# Patient Record
Sex: Male | Born: 1985 | Race: White | Hispanic: No | Marital: Married | Smoking: Former smoker
Health system: Southern US, Community
[De-identification: ages and names within clinical notes are randomized; demographics above are authoritative.]

## PROBLEM LIST (undated history)

## (undated) DIAGNOSIS — S0500XA Injury of conjunctiva and corneal abrasion without foreign body, unspecified eye, initial encounter: Secondary | ICD-10-CM

## (undated) DIAGNOSIS — L509 Urticaria, unspecified: Secondary | ICD-10-CM

## (undated) DIAGNOSIS — R079 Chest pain, unspecified: Secondary | ICD-10-CM

## (undated) DIAGNOSIS — K5090 Crohn's disease, unspecified, without complications: Secondary | ICD-10-CM

## (undated) DIAGNOSIS — J329 Chronic sinusitis, unspecified: Secondary | ICD-10-CM

## (undated) DIAGNOSIS — R1011 Right upper quadrant pain: Secondary | ICD-10-CM

## (undated) DIAGNOSIS — S20212A Contusion of left front wall of thorax, initial encounter: Secondary | ICD-10-CM

## (undated) HISTORY — DX: Contusion of left front wall of thorax, initial encounter: S20.212A

## (undated) HISTORY — DX: Chronic sinusitis, unspecified: J32.9

## (undated) HISTORY — DX: Crohn's disease, unspecified, without complications: K50.90

## (undated) HISTORY — DX: Urticaria, unspecified: L50.9

## (undated) HISTORY — DX: Right upper quadrant pain: R10.11

## (undated) HISTORY — PX: KNEE SURGERY: SHX244

## (undated) HISTORY — PX: EYE SURGERY: SHX253

## (undated) HISTORY — DX: Chest pain, unspecified: R07.9

## (undated) HISTORY — PX: VASECTOMY: SHX75

---
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
## 2004-06-04 ENCOUNTER — Ambulatory Visit (HOSPITAL_COMMUNITY): Admission: RE | Admit: 2004-06-04 | Discharge: 2004-06-04 | Payer: Self-pay | Admitting: Orthopedic Surgery

## 2006-04-20 ENCOUNTER — Emergency Department (HOSPITAL_COMMUNITY): Admission: EM | Admit: 2006-04-20 | Discharge: 2006-04-20 | Payer: Self-pay | Admitting: Emergency Medicine

## 2009-03-12 ENCOUNTER — Encounter: Admission: RE | Admit: 2009-03-12 | Discharge: 2009-03-12 | Payer: Self-pay | Admitting: Gastroenterology

## 2009-06-01 ENCOUNTER — Emergency Department (HOSPITAL_COMMUNITY): Admission: EM | Admit: 2009-06-01 | Discharge: 2009-06-01 | Payer: Self-pay | Admitting: Emergency Medicine

## 2009-06-03 ENCOUNTER — Ambulatory Visit (HOSPITAL_COMMUNITY): Admission: RE | Admit: 2009-06-03 | Discharge: 2009-06-03 | Payer: Self-pay | Admitting: Orthopedic Surgery

## 2009-08-06 ENCOUNTER — Encounter: Admission: RE | Admit: 2009-08-06 | Discharge: 2009-08-06 | Payer: Self-pay | Admitting: Gastroenterology

## 2010-07-14 ENCOUNTER — Emergency Department (HOSPITAL_COMMUNITY): Admission: EM | Admit: 2010-07-14 | Discharge: 2010-07-14 | Payer: Self-pay | Admitting: Emergency Medicine

## 2010-07-19 ENCOUNTER — Ambulatory Visit: Payer: Self-pay | Admitting: Family Medicine

## 2010-07-19 DIAGNOSIS — R079 Chest pain, unspecified: Secondary | ICD-10-CM | POA: Insufficient documentation

## 2010-07-19 DIAGNOSIS — R1011 Right upper quadrant pain: Secondary | ICD-10-CM | POA: Insufficient documentation

## 2010-07-19 LAB — CONVERTED CEMR LAB
Glucose, Urine, Semiquant: NEGATIVE
WBC Urine, dipstick: NEGATIVE

## 2010-07-20 ENCOUNTER — Encounter: Payer: Self-pay | Admitting: Family Medicine

## 2010-07-27 ENCOUNTER — Encounter: Payer: Self-pay | Admitting: Family Medicine

## 2010-09-23 ENCOUNTER — Ambulatory Visit: Payer: Self-pay | Admitting: Family Medicine

## 2010-09-23 DIAGNOSIS — S20219A Contusion of unspecified front wall of thorax, initial encounter: Secondary | ICD-10-CM

## 2010-10-09 ENCOUNTER — Ambulatory Visit: Payer: Self-pay | Admitting: Emergency Medicine

## 2010-10-09 LAB — CONVERTED CEMR LAB: Rapid Strep: NEGATIVE

## 2011-01-18 NOTE — Assessment & Plan Note (Signed)
Summary: Fever, HA, sorethroat, cough-yellow x 4 dys rm 4   Vital Signs:  Patient Profile:   25 Years Old Male CC:      Cold & URI symptoms Height:     74 inches Weight:      175 pounds O2 Sat:      99 % O2 treatment:    Room Air Temp:     98.7 degrees F oral Pulse rate:   108 / minute Pulse rhythm:   regular Resp:     16 per minute BP sitting:   141 / 84  (left arm) Cuff size:   regular  Vitals Entered By: Areta Haber CMA (October 09, 2010 2:56 PM)                  Current Allergies: ! * MILK           History of Present Illness History from: patient Chief Complaint: Cold & URI symptoms History of Present Illness: Patient complains of onset of cold symptoms for 5 days.  They have been using no OTC meds. + sore throat + cough No pleuritic pain No wheezing + nasal congestion + post-nasal drainage No sinus pain/pressure No itchy/red eyes No earache No hemoptysis No SOB No chills/sweats + fever No nausea No vomiting No abdominal pain No diarrhea No skin rashes No fatigue No myalgias No headache   Current Problems: UPPER RESPIRATORY INFECTION, ACUTE (ICD-465.9) CONTUSION, LEFT RIB (ICD-922.1) COUGH (ICD-786.2) RUQ PAIN (ICD-789.01) CHEST PAIN, RIGHT (ICD-786.50)   Current Meds ASACOL HD 800 MG TBEC (MESALAMINE)  MOBIC 7.5 MG TABS (MELOXICAM) sig 1 by mouth q day * TRAMADOL 50 sig 1 by mouth q6-8hrs as needed for break through pain TRAMADOL HCL 50 MG TABS (TRAMADOL HCL) sig 1 by mouth q6-8hrs prn CHERATUSSIN AC 100-10 MG/5ML SYRP (GUAIFENESIN-CODEINE) 5-10cc by mouth every 6 hours as needed for cough ZITHROMAX Z-PAK 250 MG TABS (AZITHROMYCIN) use as directed  REVIEW OF SYSTEMS Constitutional Symptoms       Complains of fever.     Denies chills, night sweats, weight loss, weight gain, and fatigue.  Eyes       Denies change in vision, eye pain, eye discharge, glasses, contact lenses, and eye surgery. Ear/Nose/Throat/Mouth  Complains of sore throat.      Denies hearing loss/aids, change in hearing, ear pain, ear discharge, dizziness, frequent runny nose, frequent nose bleeds, sinus problems, hoarseness, and tooth pain or bleeding.      Comments: x 4 dys Respiratory       Complains of productive cough.      Denies dry cough, wheezing, shortness of breath, asthma, bronchitis, and emphysema/COPD.  Cardiovascular       Denies murmurs, chest pain, and tires easily with exhertion.    Gastrointestinal       Denies stomach pain, nausea/vomiting, diarrhea, constipation, blood in bowel movements, and indigestion. Genitourniary       Denies painful urination, kidney stones, and loss of urinary control. Neurological       Complains of headaches.      Denies paralysis, seizures, and fainting/blackouts. Musculoskeletal       Denies muscle pain, joint pain, joint stiffness, decreased range of motion, redness, swelling, muscle weakness, and gout.  Skin       Denies bruising, unusual mles/lumps or sores, and hair/skin or nail changes.  Psych       Denies mood changes, temper/anger issues, anxiety/stress, speech problems, depression, and sleep problems. Other Comments: yellow. Pt has  not seen his PCP for this.   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  mild tonsillar enlargment Neck: neck supple,  trachea midline, no masses Chest/Lungs: no rales,  wheezes, or rhonchi bilateral, breath sounds equal without effort Heart: regular rate and  rhythm, no murmur Skin: no obvious rashes or lesions MSE: oriented to time, place, and person Assessment New Problems: UPPER RESPIRATORY INFECTION, ACUTE (ICD-465.9)   Patient Education: Patient and/or caregiver instructed in the following: rest, fluids, Tylenol prn.  Plan New Medications/Changes: ZITHROMAX Z-PAK 250 MG TABS (AZITHROMYCIN) use as directed  #1 x 0, 10/09/2010, Hoyt Koch MD CHERATUSSIN AC 100-10 MG/5ML SYRP (GUAIFENESIN-CODEINE) 5-10cc by mouth every 6 hours as needed for cough  #6oz x 0, 10/09/2010, Hoyt Koch MD  New Orders: Est. Patient Level III [45409] Rapid Strep [81191] Planning Comments:   Sudafed + Cough medicine for a few days If getting worse, can start the Zpak in 5 days Follow-up with your primary care physician if not improving or if getting worse   The patient and/or caregiver has been counseled thoroughly with regard to medications prescribed including dosage, schedule, interactions, rationale for use, and possible side effects and they verbalize understanding.  Diagnoses and expected course of recovery discussed and will return if not improved as expected or if the condition worsens. Patient and/or caregiver verbalized understanding.  Prescriptions: ZITHROMAX Z-PAK 250 MG TABS (AZITHROMYCIN) use as directed  #1 x 0   Entered and Authorized by:   Hoyt Koch MD   Signed by:   Hoyt Koch MD on 10/09/2010   Method used:   Print then Give to Patient   RxID:   [id number] CHERATUSSIN AC 100-10 MG/5ML SYRP (GUAIFENESIN-CODEINE) 5-10cc by mouth every 6 hours as needed for cough  #6oz x 0   Entered and Authorized by:   Hoyt Koch MD   Signed by:   Hoyt Koch MD on 10/09/2010   Method used:   Print then Give to Patient   RxID:   [id number]   Orders Added: 1)  Est. Patient Level III [10272] 2)  Rapid Strep  [53664]    Laboratory Results  Date/Time Received: October 09, 2010 3:05 PM  Date/Time Reported: October 09, 2010 3:05 PM   Other Tests  Rapid Strep: negative  Kit Test Internal QC: Negative   (Normal Range: Negative)
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
## 2011-01-18 NOTE — Assessment & Plan Note (Signed)
Summary: PAIN IN RIGHT LUNG   Vital Signs:  Patient Profile:   25 Years Old Male CC:      Right chest/flank pain x 2 days Height:     74 inches Weight:      179 pounds O2 Sat:      97 % O2 treatment:    Room Air Temp:     98.7 degrees F oral Pulse rate:   93 / minute Pulse rhythm:   regular Resp:     14 per minute BP sitting:   135 / 80  (right arm) Cuff size:   regular  Vitals Entered By: Georgiann Mccoy (July 19, 2010 2:19 PM)                  Current Allergies: ! * MILKHistory of Present Illness Chief Complaint: Right chest/flank pain x 2 days History of Present Illness:  Subjective:  Patient complains of onset of pleuritic pain in his right lower lateral chest two days ago.   The pain is worse in the morning.  He describes it as sharp and stabbing.  The pain is worse with deep inspiration and supine on his right side.  He denies cough.  No recent chest injury.  No fevers, chills, and sweats. One week ago he developed groin pain and was evaluated by a urologist two days later.  Patient states he was diagnosed with a "blood clot" in the base of his penis, treated with doxycycline, oxycodone, and NSAID.  The groin pain has resolved.  No GI or GU symptoms. He has a history of chron's disease, presently under control.  He states that he has a history of smoking and using alcohol to excess.  Current Meds ASACOL HD 800 MG TBEC (MESALAMINE)  ALIGN  CAPS (PROBIOTIC PRODUCT)   REVIEW OF SYSTEMS Constitutional Symptoms      Denies fever, chills, night sweats, weight loss, weight gain, and fatigue.  Eyes       Denies change in vision, eye pain, eye discharge, glasses, contact lenses, and eye surgery. Ear/Nose/Throat/Mouth       Denies hearing loss/aids, change in hearing, ear pain, ear discharge, dizziness, frequent runny nose, frequent nose bleeds, sinus problems, sore throat, hoarseness, and tooth pain or bleeding.  Respiratory       Denies dry cough, productive cough,  wheezing, shortness of breath, asthma, bronchitis, and emphysema/COPD.  Cardiovascular       Complains of chest pain.      Denies murmurs and tires easily with exhertion.    Gastrointestinal       Denies stomach pain, nausea/vomiting, diarrhea, constipation, blood in bowel movements, and indigestion. Genitourniary       Denies painful urination, kidney stones, and loss of urinary control. Neurological       Denies paralysis, seizures, and fainting/blackouts. Musculoskeletal       Denies muscle pain, joint pain, joint stiffness, decreased range of motion, redness, swelling, muscle weakness, and gout.  Skin       Denies bruising, unusual mles/lumps or sores, and hair/skin or nail changes.  Psych       Denies mood changes, temper/anger issues, anxiety/stress, speech problems, depression, and sleep problems.  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  Extraocular movement is intact.  Conjunctivae are not inflamed.  Pharynx:  Minimal erythema Neck:  Supple.  No adenopathy is present.  No thyromegaly is present  Lungs:  Clear to auscultation.  Breath sounds are equal.  Chest:  no tenderness to palpation Heart:  Regular rate and rhythm without murmurs, rubs, or gallops.  Abdomen:  Mild tenderness over liver without masses or hepatosplenomegaly.  Bowel sounds are present.  No CVA or flank tenderness.  Skin:  No rash urinalysis (dipstick):  mod bili, mod blood. + protein, urobil present Chest X-ray:  negative Assessment New Problems: RUQ PAIN (ICD-789.01) CHEST PAIN, RIGHT (ICD-786.50)  PROBABLY MUSCULOSKELETAL PAIN.  POSSIBLE EARLY  SHINGLES.  HOWEVER,  NOTE BILIRUBEN IN URINE AND TENDERNESS OVER LIVER EDGE.  PATIENT ADMITS TO HEAVY ALCOHOL USE  Plan New Orders: T-Chest x-ray, 2 views [71020] Urinalysis [81003-65000] CBC w/Diff [[id number]] T-Comprehensive Metabolic Panel [09811-91478] New Patient Level III [29562] Planning Comments:   Check CMP  Advised to avoid alcohol, Tylenol. Call if rash develops (will need Rx for Valtrex) Follow-up with PCP if not improving         The patient and/or caregiver has been counseled thoroughly with regard to medications prescribed including dosage, schedule, interactions, rationale for use, and possible side effects and they verbalize understanding.  Diagnoses and expected course of recovery discussed and will return if not improved as expected or if the condition worsens. Patient and/or caregiver verbalized understanding.   Orders Added: 1)  T-Chest x-ray, 2 views [71020] 2)  Urinalysis [81003-65000] 3)  CBC w/Diff [13086-57846] 4)  T-Comprehensive Metabolic Panel [96295-28413] 5)  New Patient Level III [99203]  Laboratory Results   Urine Tests  Date/Time Received: July 19, 2010 3:04 PM  Date/Time Reported: July 19, 2010 3:04 PM   Routine Urinalysis   Color: yellow Appearance: Clear Glucose: negative   (Normal Range: Negative) Bilirubin: moderate   (Normal Range: Negative) Ketone: trace (5)   (Normal Range: Negative) Spec. Gravity: 1.025   (Normal Range: 1.003-1.035) Blood: moderate   (Normal Range: Negative) pH: 6.5   (Normal Range: 5.0-8.0) Protein: 30   (Normal Range: Negative) Urobilinogen: 1.0   (Normal Range: 0-1) Nitrite: negative   (Normal Range: Negative) Leukocyte Esterace: negative   (Normal Range: Negative)       Appended Document: PAIN IN RIGHT LUNG CBC:  WBC 7.6, Hgb 15.2, platelets 264  Appended Document: PAIN IN RIGHT LUNG CMP results:  glucose slightly elevated 106.  Alkaline phosphatase slightly elevated at 119. Notified  patient:  left message.

## 2011-03-05 LAB — URINE MICROSCOPIC-ADD ON

## 2011-03-05 LAB — URINALYSIS, ROUTINE W REFLEX MICROSCOPIC
Nitrite: NEGATIVE
Specific Gravity, Urine: 1.021 (ref 1.005–1.030)
Urobilinogen, UA: 1 mg/dL (ref 0.0–1.0)

## 2011-03-31 ENCOUNTER — Encounter (HOSPITAL_BASED_OUTPATIENT_CLINIC_OR_DEPARTMENT_OTHER)
Admission: RE | Admit: 2011-03-31 | Discharge: 2011-03-31 | Disposition: A | Discharge status: Home / Self Care | Payer: BC Managed Care – PPO | Source: Ambulatory Visit | Attending: General Surgery | Admitting: General Surgery

## 2011-03-31 LAB — HEPATIC FUNCTION PANEL
Alkaline Phosphatase: 80 U/L (ref 39–117)
Indirect Bilirubin: 0.6 mg/dL (ref 0.3–0.9)
Total Bilirubin: 0.8 mg/dL (ref 0.3–1.2)

## 2011-03-31 LAB — BASIC METABOLIC PANEL
BUN: 9 mg/dL (ref 6–23)
CO2: 27 mEq/L (ref 19–32)
Chloride: 104 mEq/L (ref 96–112)
Creatinine, Ser: 0.97 mg/dL (ref 0.4–1.5)

## 2011-04-01 ENCOUNTER — Other Ambulatory Visit: Payer: Self-pay | Admitting: General Surgery

## 2011-04-01 ENCOUNTER — Ambulatory Visit (HOSPITAL_BASED_OUTPATIENT_CLINIC_OR_DEPARTMENT_OTHER)
Admission: RE | Admit: 2011-04-01 | Discharge: 2011-04-01 | Disposition: A | Discharge status: Home / Self Care | Payer: BC Managed Care – PPO | Source: Ambulatory Visit | Attending: General Surgery | Admitting: General Surgery

## 2011-04-01 DIAGNOSIS — Z01812 Encounter for preprocedural laboratory examination: Secondary | ICD-10-CM | POA: Insufficient documentation

## 2011-04-01 DIAGNOSIS — K603 Anal fistula, unspecified: Secondary | ICD-10-CM | POA: Insufficient documentation

## 2011-04-01 DIAGNOSIS — K5090 Crohn's disease, unspecified, without complications: Secondary | ICD-10-CM | POA: Insufficient documentation

## 2011-04-01 DIAGNOSIS — F17200 Nicotine dependence, unspecified, uncomplicated: Secondary | ICD-10-CM | POA: Insufficient documentation

## 2011-04-01 LAB — POCT HEMOGLOBIN-HEMACUE: Hemoglobin: 14.7 g/dL (ref 13.0–17.0)

## 2011-04-11 NOTE — Op Note (Signed)
NAMEZORION, NIMS              ACCOUNT NO.:  [id number]  MEDICAL RECORD NO.:  [id number]           PATIENT TYPE:  LOCATION:                                 FACILITY:  PHYSICIAN:  Marland Kitchen T. 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  POSTOPERATIVE DIAGNOSIS:  Fistula in ano.  SURGICAL PROCEDURES:  Examination under anesthesia and anal fistulotomy.  SURGEON:  Darene Lamer. Pura Picinich, MD  ANESTHESIA:  General.  BRIEF HISTORY:  Mr. Coolidge Breeze is a 25 year old male diagnosed 2-3 years ago with Crohn disease.  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 Symptoms have persisted and he presented for evaluation.  Examination in the office showed what appeared to be an external fistula opening just exterior to the dentate line in the anterior midline.  The patient was difficult to examine in the office and I could not really examined for internal opening.  After discussion with the patient and his family, we elected to proceed with examination under anesthesia and proceeding based on these findings with probable placement of a seton in preparation for eventual fistula plug.  Nature of procedure, alternatives, and possible findings at the time of exam were discussed. He is now brought to the operating room for the procedure.  DESCRIPTION OF OPERATION:  The patient was brought to the operating room and placed in supine position on the operating table.  Laryngeal mask general anesthesia was induced.  He was carefully placed in lithotomy position and perineum was sterilely prepped and draped.  Correct patient and procedure were verified.  Digital rectal exam was unremarkable without masses or induration.   Anal retractor was placed and careful exam performed.  There were moderate circumferential internal hemorrhoids with some slight inflammation and friability.  Otherwise, in the anterior midline was an external skin opening, again really just exterior to the dentate line.  This was somewhat wide mouth and a probe could be easily passed and exited in an internal opening that was only fairly less than a centimeter above this with again kind of wide internal opening and the overlying tissue was very superficial consisting only of anoderm and possibly a little bit of submucosa.  This clearly did not traverse any of the external or internal sphincter.  It possibly appeared that this could have been even a fissure which had healed with bridging skin over the top of the fissure rather than a true fistula.  However, this clearly did not warrant placement of the seton or would not be necessary to treat this with any sort of sphincter- sparing procedure such as a fistula plug and therefore after carefully determined that there were no further deep tracts in any direction, I excised the bridging anoderm over this which was only about 6-7 mm in length.  The tissue was sent for pathology.  Hemostasis of the wound edges was obtained with  cautery.  Dry sterile dressing was applied.  The patient was taken to recovery in good condition.     Darene Lamer. Rajah Tagliaferro, M.D.     [place]  D:  04/01/2011  T:  04/02/2011  Job:  [id number]  Electronically Signed by Excell Seltzer M.D. on 04/11/2011 10:02:10 AM

## 2011-05-06 NOTE — Op Note (Signed)
NAME:  James Ortiz, James Ortiz                        ACCOUNT NO.:  [id number]   MEDICAL RECORD NO.:  [id number]                   PATIENT TYPE:  AMB   LOCATION:  DAY                                  FACILITY:  [facility]   PHYSICIAN:  Tarri Glenn, M.D.               DATE OF BIRTH:  1986/07/17   DATE OF PROCEDURE:  06/04/2004  DATE OF DISCHARGE:                                 OPERATIVE REPORT   PREOPERATIVE DIAGNOSIS:  Bucket-handle tear medial meniscus, right knee.   POSTOPERATIVE DIAGNOSIS:  Bucket-handle tear medial meniscus, right knee.   OPERATION:  Right knee arthroscopy with excision of bucket-handle tear of  medial meniscus.   SURGEON:  Laurice Record. Aplington, M.D.   ASSISTANT:  Nurse.   ANESTHESIA:  General.   PATHOLOGY AND JUSTIFICATION FOR PROCEDURE:  Recent knee injury with MRI  showing bucket-handle tear of the medial meniscus.  The remainder of the  joint looked unremarkable; this finding was confirmed at surgery.   DESCRIPTION OF PROCEDURE:  Satisfactory general anesthesia, leg was  esmarched out nonsterilely, pneumatic tourniquet, thigh stabilizer.  Knee  was prepped with DuraPrep and draped in a sterile field.  Ace wrap and knee  support to left leg.  Superior and medial saline inflow.  First, through an  anterolateral portal, the lateral compartment of the knee joint was  evaluated with some synovitis present which I resected.  The meniscus and  the joint looked unremarkable.  Looked up in the lateral gutter and  suprapatellar area, and all this looked unremarkable.  I then reversed  portals.  ACL was intact.  He had a bucket-handle tear of the medial  meniscus which I handled by suctioning it at the anterior mid proximal third  junction, and then I was able to also section it posteriorly with scissors  and removed the fragment as a unit.  He had very little residual rim  remaining with a good bit of irregularity anteriorly and posterior in the  corner.  I trimmed this  back with baskets and shaved it down until smooth  with 3.5 shaver.  I checked and did not find any residual fragments in the  intercondylar area or elsewhere in the knee.  Knee joint was then irrigated  until clear and all fluid possible removed.  The two anterior portals were  closed with 4-0 nylon.  Marcaine 0.5% 20 cc with adrenalin and 4 mg of  morphine were then instilled through the inflow apparatus which was removed  and this portal closed with 4-0 nylon as well.  Betadine, Adaptic dry  sterile dressing were applied.  Tourniquet was released.  He tolerated the  procedure well and was taken to the recovery room in satisfactory condition  with no known complications.  Tarri Glenn, M.D.    JA/MEDQ  D:  06/04/2004  T:  06/04/2004  Job:  [id number]

## 2012-07-29 ENCOUNTER — Ambulatory Visit (INDEPENDENT_AMBULATORY_CARE_PROVIDER_SITE_OTHER): Payer: BC Managed Care – PPO | Admitting: Emergency Medicine

## 2012-07-29 VITALS — BP 125/78 | HR 92 | Temp 98.7°F | Resp 16 | Ht 74.0 in | Wt 181.8 lb

## 2012-07-29 DIAGNOSIS — H9209 Otalgia, unspecified ear: Secondary | ICD-10-CM

## 2012-07-29 DIAGNOSIS — H6590 Unspecified nonsuppurative otitis media, unspecified ear: Secondary | ICD-10-CM

## 2012-07-29 MED ORDER — MOMETASONE FUROATE 50 MCG/ACT NA SUSP: 2.0000 | Freq: Every day | NASAL | Status: DC

## 2012-07-29 NOTE — Patient Instructions (Signed)
Serous Otitis Media   Serous otitis media is also known as otitis media with effusion (OME). It means there is fluid in the middle ear space. This space contains the bones for hearing and air. Air in the middle ear space helps to transmit sound.   The air gets there through the eustachian tube. This tube goes from the back of the throat to the middle ear space. It keeps the pressure in the middle ear the same as the outside world. It also helps to drain fluid from the middle ear space.  CAUSES   OME occurs when the eustachian tube gets blocked. Blockage can come from:   Ear infections.   Colds and other upper respiratory infections.   Allergies.   Irritants such as cigarette smoke.   Sudden changes in air pressure (such as descending in an airplane).   Enlarged adenoids.  During colds and upper respiratory infections, the middle ear space can become temporarily filled with fluid. This can happen after an ear infection also. Once the infection clears, the fluid will generally drain out of the ear through the eustachian tube. If it does not, then OME occurs.  SYMPTOMS    Hearing loss.   A feeling of fullness in the ear - but no pain.   Young children may not show any symptoms.  DIAGNOSIS    Diagnosis of OME is made by an ear exam.   Tests may be done to check on the movement of the eardrum.   Hearing exams may be done.  TREATMENT    The fluid most often goes away without treatment.   If allergy is the cause, allergy treatment may be helpful.   Fluid that persists for several months may require minor surgery. A small tube is placed in the ear drum to:   Drain the fluid.   Restore the air in the middle ear space.   In certain situations, antibiotics are used to avoid surgery.   Surgery may be done to remove enlarged adenoids (if this is the cause).  HOME CARE INSTRUCTIONS    Keep children away from tobacco smoke.   Be sure to keep follow up appointments, if any.  SEEK MEDICAL CARE IF:    Hearing is  not better in 3 months.   Hearing is worse.   Ear pain.   Drainage from the ear.   Dizziness.  Document Released: 02/25/2004 Document Revised: 11/24/2011 Document Reviewed: 12/25/2008  Exit[employer] Patient Information 2012 [employer].

## 2012-07-29 NOTE — Progress Notes (Signed)
   Date:  07/29/2012   Name:  James Ortiz   DOB:  [birth year]   MRN:  [id number] Gender: male  Age: 26 y.o.  PCP:  No primary provider on file.    Chief Complaint: Otalgia   History of Present Illness:  James Ortiz is a 26 y.o. pleasant patient who presents with the following:  Treated for sinusitis with augmentin and it has resolved, leaving him with persistent fullness and decreased hearing in his left ear.  No fever or chills, nasal congestion or drainage. No cough.  Patient Active Problem List  Diagnosis  . CHEST PAIN, RIGHT  . RUQ PAIN  . CONTUSION, LEFT RIB    No past medical history on file.  No past surgical history on file.  History  Substance Use Topics  . Smoking status: Current Everyday Smoker  . Smokeless tobacco: Not on file  . Alcohol Use: Not on file    No family history on file.  Allergies  Allergen Reactions  . Milk-Related Compounds     Medication list has been reviewed and updated.  No current outpatient prescriptions on file prior to visit.    Review of Systems:  As per HPI, otherwise negative.    Physical Examination: Filed Vitals:   07/29/12 1749  BP: 125/78  Pulse: 92  Temp: 98.7 F (37.1 C)  Resp: 16   Filed Vitals:   07/29/12 1749  Height: 6\' 2"  (1.88 m)  Weight: 181 lb 12.8 oz (82.464 kg)   Body mass index is 23.34 kg/(m^2). Ideal Body Weight: Weight in (lb) to have BMI = 25: 194.3    GEN: WDWN, NAD, Non-toxic, Alert & Oriented x 3 HEENT: Atraumatic, Normocephalic.  Ears and Nose: No external deformity.  Left serous otitis media EXTR: No clubbing/cyanosis/edema NEURO: Normal gait.  PSYCH: Normally interactive. Conversant. Not depressed or anxious appearing.  Calm demeanor.    Assessment and Plan: Serous otitis media mucinex D nasonex  Follow up as needed  Carmelina Dane, MD

## 2015-07-21 ENCOUNTER — Encounter: Payer: Self-pay | Admitting: Cardiovascular Disease

## 2015-07-21 ENCOUNTER — Ambulatory Visit (INDEPENDENT_AMBULATORY_CARE_PROVIDER_SITE_OTHER): Payer: BLUE CROSS/BLUE SHIELD | Admitting: Cardiovascular Disease

## 2015-07-21 ENCOUNTER — Ambulatory Visit (INDEPENDENT_AMBULATORY_CARE_PROVIDER_SITE_OTHER)
Admission: RE | Admit: 2015-07-21 | Discharge: 2015-07-21 | Disposition: A | Discharge status: Home / Self Care | Payer: BLUE CROSS/BLUE SHIELD | Source: Ambulatory Visit | Attending: Cardiovascular Disease | Admitting: Cardiovascular Disease

## 2015-07-21 VITALS — BP 100/58 | HR 65 | Ht 74.0 in | Wt 195.0 lb

## 2015-07-21 DIAGNOSIS — R079 Chest pain, unspecified: Secondary | ICD-10-CM

## 2015-07-21 NOTE — Patient Instructions (Addendum)
Medication Instructions:  NO CHANGES  Labwork: NONE  Testing/Procedures: Your physician has requested that you have an exercise tolerance test. For further information please visit [email or web address]. Please also follow instruction sheet, as given.  A chest x-ray takes a picture of the organs and structures inside the chest, including the heart, lungs, and blood vessels. This test can show several things, including, whether the heart is enlarges; whether fluid is building up in the lungs; and whether pacemaker / defibrillator leads are still in place.  Follow-Up: AS NEEDED  Any Other Special Instructions Will Be Listed Below (If Applicable).

## 2015-07-21 NOTE — Progress Notes (Signed)
Patient ID: James Ortiz, male   DOB: 01-Feb-1986, 29 y.o.   MRN: [id number]      Cardiology Office Note   Date:  07/21/2015   ID:  James Ortiz, DOB [birth year], MRN [id number]  PCP:  No primary care provider on file.  Cardiologist:   Jenkins Rouge, MD   No chief complaint on file.     History of Present Illness: James Ortiz is a 29 y.o. male who presents for chest pain evaluation Pain last few weeks.  Atypical sharp radiates to arm at times. Can go to gym and use rowing machine with no issues  Pain is intermitant lasts minutes.  No associated dyspnea palpitations or syncope.  Smokes 1/2 ppd.  Tried to quit 5 months ago with vaping has not tried patch or gum before.  Last CXR more than 2 years ago  Pain is not positional or pleuritic.  No cough sputum or fever.  Occasional ETOH on weekends No other drugs.  No family history of CAD.  Has had chrones in past but not active with  Diet Rx at this time No other inflammatory disease or arthritis.      Past Medical History  Diagnosis Date  . Sinusitis   . Chest pain   . RUQ pain   . Contusion of rib on left side     Past Surgical History  Procedure Laterality Date  . No surgical history       Current Outpatient Prescriptions  Medication Sig Dispense Refill  . mometasone (NASONEX) 50 MCG/ACT nasal spray Place 2 sprays into the nose daily. 17 g 12   No current facility-administered medications for this visit.    Allergies:   Milk-related compounds    Social History:  The patient  reports that he has been smoking.  He does not have any smokeless tobacco history on file.   Family History:  The patient's family history includes Other in some other family members.    ROS:  Please see the history of present illness.   Otherwise, review of systems are positive for none.   All other systems are reviewed and negative.    PHYSICAL EXAM: VS:  There were no vitals taken for this visit. , BMI There is no weight on  file to calculate BMI. Affect appropriate Healthy:  appears stated age 46: normal Neck supple with no adenopathy JVP normal no bruits no thyromegaly Lungs clear with no wheezing and good diaphragmatic motion Heart:  S1/S2 no murmur, no rub, gallop or click PMI normal Abdomen: benighn, BS positve, no tenderness, no AAA no bruit.  No HSM or HJR Distal pulses intact with no bruits No edema Neuro non-focal Skin warm and dry No muscular weakness    EKG:   SR rate 65 normal ECG    Recent Labs: No results found for requested labs within last 365 days.    Lipid Panel No results found for: CHOL, TRIG, HDL, CHOLHDL, VLDL, LDLCALC, LDLDIRECT    Wt Readings from Last 3 Encounters:  07/29/12 82.464 kg (181 lb 12.8 oz)  10/09/10 79.379 kg (175 lb)  09/23/10 81.194 kg (179 lb)      Other studies Reviewed: Additional studies/ records that were reviewed today include: Epic notes.    ASSESSMENT AND PLAN:  1.  Chest Pain:  Atypical normal ECG  F/u ETT  Discussed usefulness of coronary calcium score in regard to risk stratification with him as well 2. Smoking counseled for less  than 10 minutes.  Discussed vascular/lung complications  CXR ordered.  Suggested 29m nicorrette gum or 14 mg Patches to help quit  3.  Chrones  Quiescent  F[place]u GI as needed [place]    Current medicines are reviewed at length with the patient today.  The patient does not have concerns regarding medicines.  The following changes have been made:  no change  Labs/ tests ordered today include:  CXR and ETT   No orders of the defined types were placed in this encounter.     Disposition:   FU with PRN     Signed, PJenkins Rouge MD  07/21/2015 7:50 AM    [place]  [id number]: [phone] Fax: [phone]

## 2015-07-29 NOTE — Addendum Note (Signed)
Addended by: Freada Bergeron on: 07/29/2015 04:57 PM   Modules accepted: Orders

## 2015-08-11 ENCOUNTER — Telehealth (HOSPITAL_COMMUNITY): Payer: Self-pay

## 2015-08-11 NOTE — Telephone Encounter (Signed)
Encounter complete. 

## 2015-08-13 ENCOUNTER — Ambulatory Visit (HOSPITAL_COMMUNITY)
Admission: RE | Admit: 2015-08-13 | Discharge: 2015-08-13 | Disposition: A | Discharge status: Home / Self Care | Payer: BLUE CROSS/BLUE SHIELD | Source: Ambulatory Visit | Attending: Cardiovascular Disease | Admitting: Cardiovascular Disease

## 2015-08-13 DIAGNOSIS — R079 Chest pain, unspecified: Secondary | ICD-10-CM | POA: Diagnosis not present

## 2015-08-13 LAB — EXERCISE TOLERANCE TEST
CHL CUP MPHR: 190 {beats}/min
CHL CUP RESTING HR STRESS: 94 {beats}/min
CSEPEDS: 20 s
CSEPPHR: 203 {beats}/min
Estimated workload: 15.8 METS
Exercise duration (min): 13 min
Percent HR: 106 %
RPE: 16

## 2017-01-30 IMAGING — CR DG CHEST 2V
2 series · 2 of 2 positions shown · non-contrast
Comparison: Chest x-ray of September 23, 2010

CLINICAL DATA: Two week history of intermittent left-sided chest
pain with tingling in the arms and fingers, history of tobacco use

EXAM:
CHEST  2 VIEW

[view not recorded (1 of 2)]
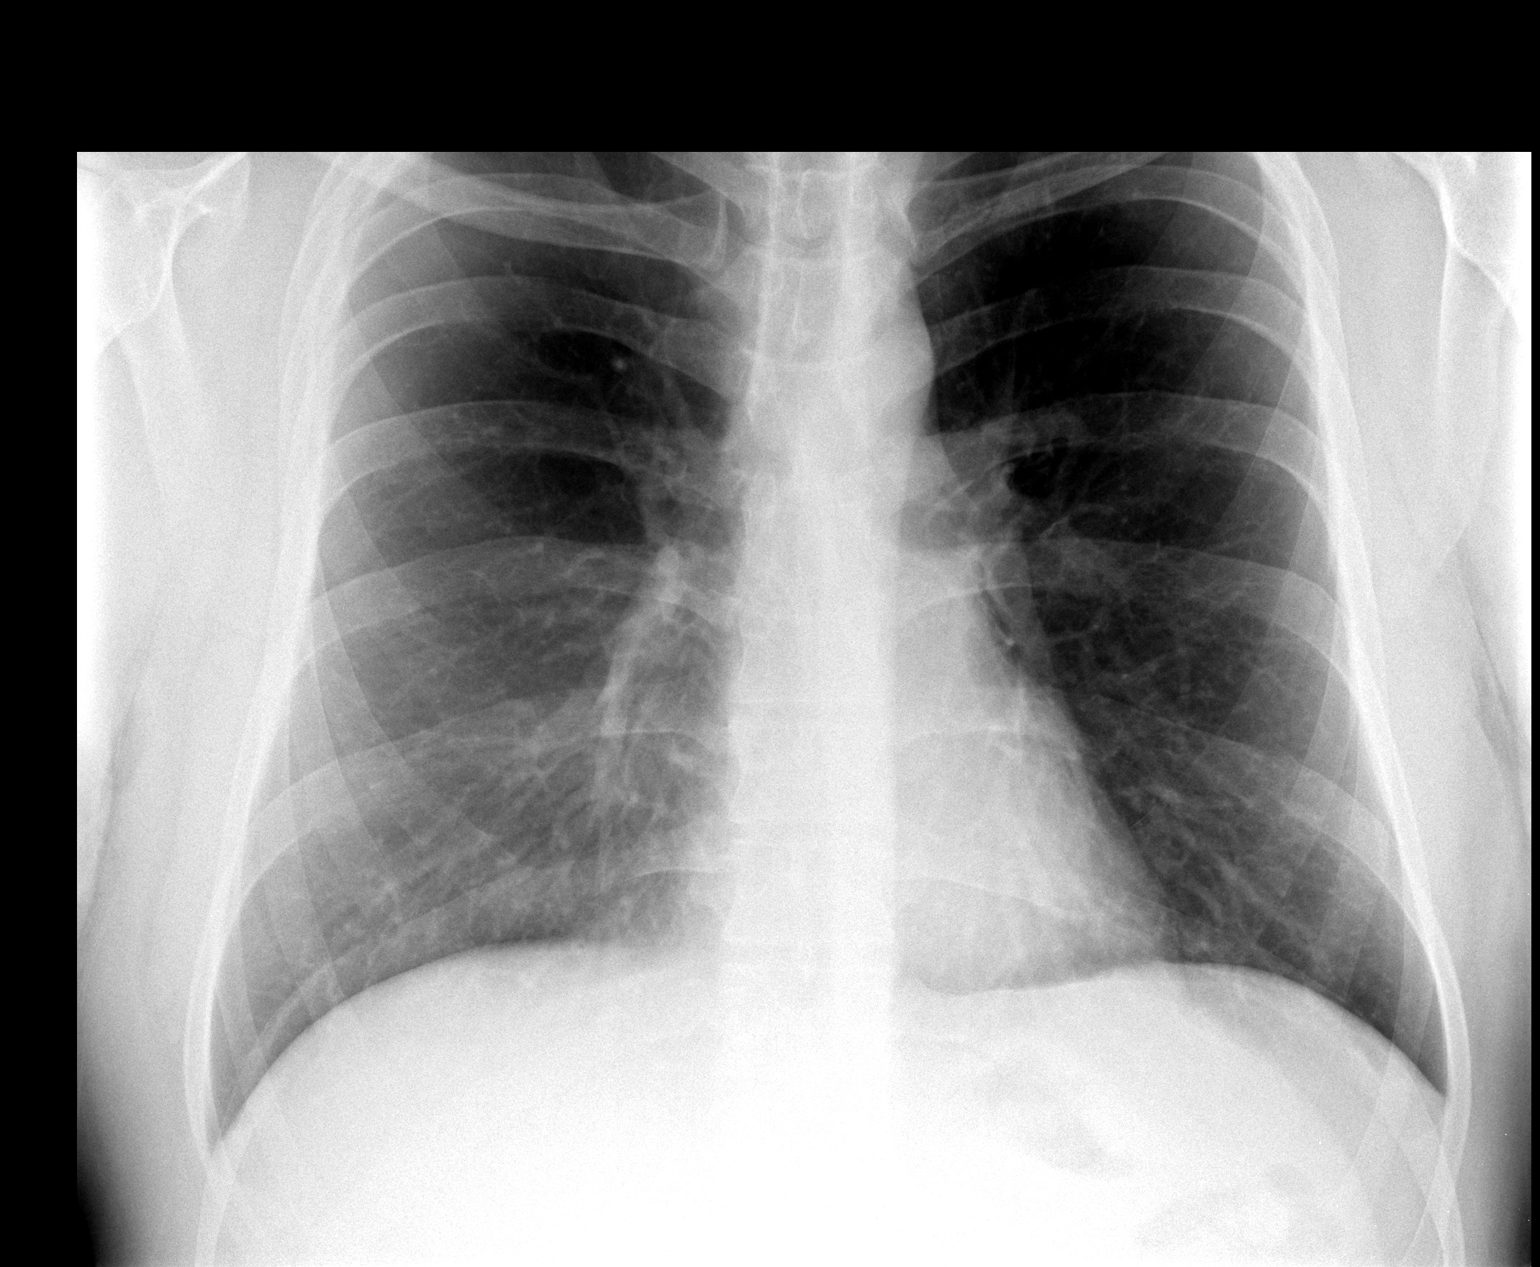

[view not recorded (2 of 2)]
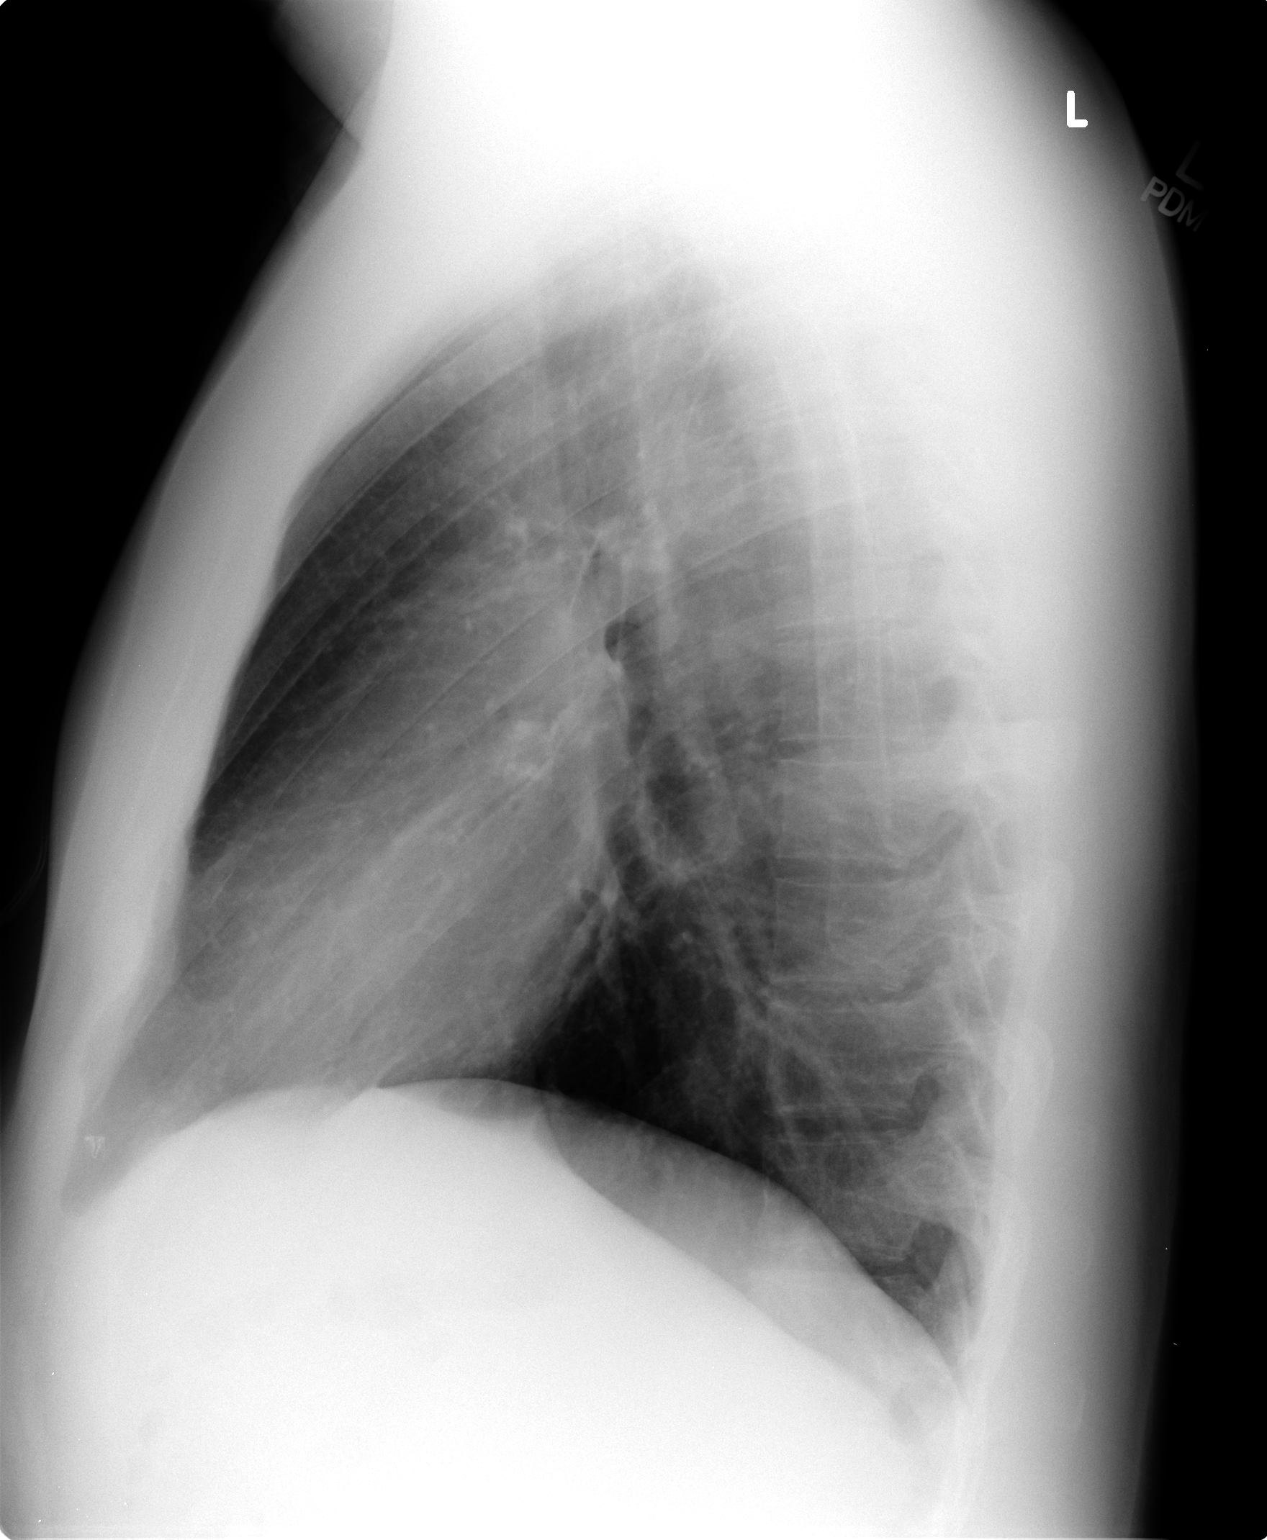

[2 of 2 positions shown; findings below may reference images not displayed]

FINDINGS: The lungs are well-expanded and clear. The heart and pulmonary
vascularity are normal. The mediastinum is normal in width. There is
no pleural effusion. The bony thorax exhibits no acute abnormality.
IMPRESSION: There is no active cardiopulmonary disease.

## 2017-10-22 ENCOUNTER — Emergency Department (HOSPITAL_COMMUNITY)
Admission: EM | Admit: 2017-10-22 | Discharge: 2017-10-22 | Disposition: A | Discharge status: Home / Self Care | Payer: BLUE CROSS/BLUE SHIELD | Attending: Emergency Medicine | Admitting: Emergency Medicine

## 2017-10-22 ENCOUNTER — Encounter (HOSPITAL_COMMUNITY): Payer: Self-pay | Admitting: *Deleted

## 2017-10-22 ENCOUNTER — Other Ambulatory Visit: Payer: Self-pay

## 2017-10-22 DIAGNOSIS — Y929 Unspecified place or not applicable: Secondary | ICD-10-CM | POA: Insufficient documentation

## 2017-10-22 DIAGNOSIS — X58XXXA Exposure to other specified factors, initial encounter: Secondary | ICD-10-CM | POA: Insufficient documentation

## 2017-10-22 DIAGNOSIS — Y9389 Activity, other specified: Secondary | ICD-10-CM | POA: Insufficient documentation

## 2017-10-22 DIAGNOSIS — S0502XA Injury of conjunctiva and corneal abrasion without foreign body, left eye, initial encounter: Secondary | ICD-10-CM | POA: Insufficient documentation

## 2017-10-22 DIAGNOSIS — F17210 Nicotine dependence, cigarettes, uncomplicated: Secondary | ICD-10-CM | POA: Insufficient documentation

## 2017-10-22 DIAGNOSIS — Y999 Unspecified external cause status: Secondary | ICD-10-CM | POA: Insufficient documentation

## 2017-10-22 DIAGNOSIS — S0592XA Unspecified injury of left eye and orbit, initial encounter: Secondary | ICD-10-CM | POA: Diagnosis present

## 2017-10-22 MED ORDER — TOBRAMYCIN 0.3 % OP SOLN
2.0000 [drp] | OPHTHALMIC | 0 refills | Status: DC
Start: 2017-10-22 — End: 2019-01-31

## 2017-10-22 MED ORDER — FLUORESCEIN SODIUM 1 MG OP STRP
1.0000 | ORAL_STRIP | Freq: Once | OPHTHALMIC | Status: AC
Start: 2017-10-22 — End: 2017-10-22
  Administered 2017-10-22: 1 via OPHTHALMIC

## 2017-10-22 MED ORDER — HYDROCODONE-ACETAMINOPHEN 5-325 MG PO TABS: 1.0000 | ORAL_TABLET | Freq: Four times a day (QID) | ORAL | 0 refills | Status: DC | PRN

## 2017-10-22 NOTE — ED Provider Notes (Signed)
[facility] DEPT Provider Note   CSN: [id number] Arrival date & time: 10/22/17  1218     History   Chief Complaint Chief Complaint  Patient presents with  . eye scratch    Left    HPI James Ortiz is a 31 y.o. male.  The history is provided by the patient. No language interpreter was used.  Eye Injury  This is a new problem. The current episode started 6 to 12 hours ago. The problem occurs constantly. The problem has been gradually worsening. Pertinent negatives include no headaches. Nothing aggravates the symptoms. Nothing relieves the symptoms. He has tried nothing for the symptoms.   Patient thinks his dog may have scratched his eye last p.m. he complains of pain to his left eye.  He has tearing and redness. Past Medical History:  Diagnosis Date  . Chest pain   . Contusion of rib on left side   . RUQ pain   . Sinusitis     Patient Active Problem List   Diagnosis Date Noted  . CONTUSION, LEFT RIB 09/23/2010  . CHEST PAIN, RIGHT 07/19/2010  . RUQ PAIN 07/19/2010    Past Surgical History:  Procedure Laterality Date  . KNEE SURGERY    . NO SURGICAL HISTORY         Home Medications    Prior to Admission medications   Medication Sig Start Date End Date Taking? Authorizing Provider  HYDROcodone-acetaminophen (NORCO/VICODIN) 5-325 MG tablet Take 1-2 tablets every 6 (six) hours as needed by mouth. 10/22/17   Fransico Meadow, PA-C  tobramycin (TOBREX) 0.3 % ophthalmic solution Place 2 drops every 4 (four) hours into the left eye. 10/22/17   Fransico Meadow, PA-C    Family History Family History  Problem Relation Age of Onset  . Other Unknown        HEALTHY  . Other Unknown        HEALTHY  . Other Unknown        HEALTHY    Social History Social History   Tobacco Use  . Smoking status: Current Some Day Smoker  . Smokeless tobacco: Never Used  Substance Use Topics  . Alcohol use: Yes  . Drug use: No      Allergies   Milk-related compounds   Review of Systems Review of Systems  Eyes: Positive for discharge and redness.  Neurological: Negative for headaches.  All other systems reviewed and are negative.    Physical Exam Updated Vital Signs BP 112/68 (BP Location: Right Arm)   Pulse 77   Temp 98.1 F (36.7 C) (Oral)   Resp 16   Ht 6' 2"  (1.88 m)   Wt 80.7 kg (178 lb)   SpO2 98%   BMI 22.85 kg/m   Physical Exam  Constitutional: He is oriented to person, place, and time. He appears well-developed and well-nourished.  HENT:  Head: Normocephalic.  Eyes: EOM are normal.  Injected left conjunctiva, small area of fluorescein uptake at 6:00  Neck: Normal range of motion.  Cardiovascular: Normal rate.  Pulmonary/Chest: Effort normal.  Abdominal: He exhibits no distension.  Musculoskeletal: Normal range of motion.  Neurological: He is alert and oriented to person, place, and time.  Psychiatric: He has a normal mood and affect.  Nursing note and vitals reviewed.    ED Treatments / Results  Labs (all labs ordered are listed, but only abnormal results are displayed) Labs Reviewed - No data to display  EKG  EKG Interpretation None       Radiology No results found.  Procedures Procedures (including critical care time)  Medications Ordered in ED Medications  fluorescein ophthalmic strip 1 strip (1 strip Left Eye Given 10/22/17 1411)     Initial Impression / Assessment and Plan / ED Course  I have reviewed the triage vital signs and the nursing notes.  Pertinent labs & imaging results that were available during my care of the patient were reviewed by me and considered in my medical decision making (see chart for details).     Patient counseled on possible corneal abrasion.  Is given Tobrex eyedrops and a prescription for hydrocodone.  He is advised if pain worsens or does not begin to improve over the next 24 hours he should see the ophthalmologist for  evaluation.  Advised to call the ophthalmologist for appointment if needed.  He is advised to return to the emergency department if symptoms worsen or change  Final Clinical Impressions(s) / ED Diagnoses   Final diagnoses:  Abrasion of left cornea, initial encounter   No outpatient medications have been marked as taking for the 10/22/17 encounter [facility] Encounter).   An After Visit Summary was printed and given to the patient. Meds ordered this encounter  Medications  . fluorescein ophthalmic strip 1 strip  . tobramycin (TOBREX) 0.3 % ophthalmic solution    Sig: Place 2 drops every 4 (four) hours into the left eye.    Dispense:  5 mL    Refill:  0    Order Specific Question:   Supervising Provider    Answer:   MILLER, BRIAN [[id number]]  . HYDROcodone-acetaminophen (NORCO/VICODIN) 5-325 MG tablet    Sig: Take 1-2 tablets every 6 (six) hours as needed by mouth.    Dispense:  10 tablet    Refill:  0    Order Specific Question:   Supervising Provider    Answer:   Noemi Chapel [[id number]]   New Prescriptions This SmartLink is deprecated. Use AVSMEDLIST instead to display the medication list for a patient.   Sidney Ace 10/22/17 Toney Sang, MD 10/26/17 [phone]

## 2017-10-22 NOTE — Discharge Instructions (Signed)
Return if any problems.

## 2019-01-29 ENCOUNTER — Emergency Department (HOSPITAL_COMMUNITY)
Admission: EM | Admit: 2019-01-29 | Discharge: 2019-01-29 | Disposition: A | Discharge status: Home / Self Care | Payer: BLUE CROSS/BLUE SHIELD | Attending: Emergency Medicine | Admitting: Emergency Medicine

## 2019-01-29 ENCOUNTER — Emergency Department (HOSPITAL_COMMUNITY): Payer: BLUE CROSS/BLUE SHIELD

## 2019-01-29 ENCOUNTER — Encounter (HOSPITAL_COMMUNITY): Payer: Self-pay

## 2019-01-29 ENCOUNTER — Other Ambulatory Visit: Payer: Self-pay

## 2019-01-29 DIAGNOSIS — R1084 Generalized abdominal pain: Secondary | ICD-10-CM

## 2019-01-29 DIAGNOSIS — F17200 Nicotine dependence, unspecified, uncomplicated: Secondary | ICD-10-CM | POA: Insufficient documentation

## 2019-01-29 LAB — COMPREHENSIVE METABOLIC PANEL
ALT: 13 U/L (ref 0–44)
AST: 17 U/L (ref 15–41)
Albumin: 3.6 g/dL (ref 3.5–5.0)
Alkaline Phosphatase: 55 U/L (ref 38–126)
Anion gap: 7 (ref 5–15)
BUN: 11 mg/dL (ref 6–20)
CHLORIDE: 105 mmol/L (ref 98–111)
CO2: 28 mmol/L (ref 22–32)
Calcium: 8.9 mg/dL (ref 8.9–10.3)
Creatinine, Ser: 1 mg/dL (ref 0.61–1.24)
Glucose, Bld: 107 mg/dL — ABNORMAL HIGH (ref 70–99)
POTASSIUM: 4 mmol/L (ref 3.5–5.1)
Sodium: 140 mmol/L (ref 135–145)
Total Bilirubin: 0.5 mg/dL (ref 0.3–1.2)
Total Protein: 6.9 g/dL (ref 6.5–8.1)

## 2019-01-29 LAB — URINALYSIS, ROUTINE W REFLEX MICROSCOPIC
Bilirubin Urine: NEGATIVE
Glucose, UA: NEGATIVE mg/dL
HGB URINE DIPSTICK: NEGATIVE
KETONES UR: NEGATIVE mg/dL
LEUKOCYTE UA: NEGATIVE
NITRITE: NEGATIVE
PROTEIN: NEGATIVE mg/dL
Specific Gravity, Urine: 1.017 (ref 1.005–1.030)
pH: 7 (ref 5.0–8.0)

## 2019-01-29 LAB — CBC
HEMATOCRIT: 44.7 % (ref 39.0–52.0)
HEMOGLOBIN: 13.6 g/dL (ref 13.0–17.0)
MCH: 25 pg — ABNORMAL LOW (ref 26.0–34.0)
MCHC: 30.4 g/dL (ref 30.0–36.0)
MCV: 82.3 fL (ref 80.0–100.0)
NRBC: 0 % (ref 0.0–0.2)
Platelets: 259 10*3/uL (ref 150–400)
RBC: 5.43 MIL/uL (ref 4.22–5.81)
RDW: 13.5 % (ref 11.5–15.5)
WBC: 10.4 10*3/uL (ref 4.0–10.5)

## 2019-01-29 LAB — LIPASE, BLOOD: LIPASE: 33 U/L (ref 11–51)

## 2019-01-29 MED ORDER — IOPAMIDOL (ISOVUE-370) INJECTION 76%
100.0000 mL | Freq: Once | INTRAVENOUS | Status: DC | PRN
Start: 2019-01-29 — End: 2019-01-30

## 2019-01-29 MED ORDER — ALUM & MAG HYDROXIDE-SIMETH 200-200-20 MG/5ML PO SUSP
30.0000 mL | Freq: Once | ORAL | Status: AC
  Administered 2019-01-29: 30 mL via ORAL
  Filled 2019-01-29: qty 30

## 2019-01-29 MED ORDER — SODIUM CHLORIDE 0.9% FLUSH
3.0000 mL | Freq: Once | INTRAVENOUS | Status: AC
  Administered 2019-01-29: 3 mL via INTRAVENOUS

## 2019-01-29 MED ORDER — LIDOCAINE VISCOUS HCL 2 % MT SOLN
15.0000 mL | Freq: Once | OROMUCOSAL | Status: AC
  Administered 2019-01-29: 15 mL via ORAL
  Filled 2019-01-29: qty 15

## 2019-01-29 MED ORDER — IOHEXOL 300 MG/ML  SOLN
100.0000 mL | Freq: Once | INTRAMUSCULAR | Status: AC | PRN
  Administered 2019-01-29: 100 mL via INTRAVENOUS

## 2019-01-29 NOTE — Discharge Instructions (Addendum)
There are many causes of abdominal pain. Most pain is not serious and goes away, but some pain gets worse, changes, or will not go away. Please return to the emergency department or see your doctor right away if you (or your family member) experience any of the following:   Pain that gets worse or moves to just one spot.  Pain that gets worse if you cough or sneeze.  Pain with going over a bump in the road.  Pain that does not get better in 24 hours.  Inability to keep down liquids (vomiting)--especially if you are making less urine.  Fainting.  Blood in the vomit or stool.  High fever or shaking chills.  Swelling of the abdomen.  Any new or worsening problem.   Follow-up Instructions  Turn to the emergency department for any worsening pain. Medications  Take the following medications: Over-the-counter medications for constipation.  These include MiraLAX, milk of magnesia, or a stool softener.  You can ask the pharmacist for help picking out the best constipation medication for you.   Additional Instructions  No alcohol.  No caffeine, aspirin, or cigarettes.

## 2019-01-29 NOTE — ED Notes (Signed)
Patient verbalizes understanding of discharge instructions. Opportunity for questioning and answers were provided. Armband removed by staff, pt discharged from ED.  

## 2019-01-29 NOTE — ED Triage Notes (Signed)
Pt reports severe generalized abd pain for the past 2 weeks, intermittent n.v. Pt sent here from [facility] for CT scan. No changes in bowel or bladder.

## 2019-01-29 NOTE — ED Provider Notes (Signed)
[facility]ERGENC[facility] DEPARTMENT Provider Note   CSN: [id number] Arrival date & time: 01/29/19  1537     History   Chief Complaint Chief Complaint  Patient presents with  . Abdominal Pain    HPI James Ortiz is a 33 y.o. male emergency department with chief complaint of abdominal pain x10 days.  Patient was recently diagnosed with the flu 1 week ago and was treated with Tamiflu.  While his other symptoms resolved his abdominal pain has continued.  It is located in his epigastric region and radiates all over the stomach.  He describes the pain as sharp.  He rates it 4 out of 10 severity.  He has not taken any medication for symptoms prior to arrival.  He was seen in urgent care today for this same pain and was sent to the emergency department for possible CT scan.  Pt denies any surgical history.  Denies fever, nausea, vomiting, diarrhea, urinary symptoms, chest pain, shortness of breath.    Past Medical History:  Diagnosis Date  . Chest pain   . Contusion of rib on left side   . RUQ pain   . Sinusitis     Patient Active Problem List   Diagnosis Date Noted  . CONTUSION, LEFT RIB 09/23/2010  . CHEST PAIN, RIGHT 07/19/2010  . RUQ PAIN 07/19/2010    Past Surgical History:  Procedure Laterality Date  . KNEE SURGERY    . NO SURGICAL HISTORY          Home Medications    Prior to Admission medications   Medication Sig Start Date End Date Taking? Authorizing Provider  HYDROcodone-acetaminophen (NORCO/VICODIN) 5-325 MG tablet Take 1-2 tablets every 6 (six) hours as needed by mouth. 10/22/17   Fransico Meadow, PA-C  tobramycin (TOBREX) 0.3 % ophthalmic solution Place 2 drops every 4 (four) hours into the left eye. 10/22/17   Fransico Meadow, PA-C    Family History Family History  Problem Relation Age of Onset  . Other Other        HEALTHY  . Other Other        HEALTHY  . Other Other        HEALTHY    Social History Social History    Tobacco Use  . Smoking status: Current Some Day Smoker  . Smokeless tobacco: Never Used  Substance Use Topics  . Alcohol use: Yes  . Drug use: No     Allergies   Milk-related compounds   Review of Systems Review of Systems  Gastrointestinal: Positive for abdominal pain. Negative for abdominal distention, blood in stool, diarrhea, nausea, rectal pain and vomiting.  All other systems reviewed and are negative.    Physical Exam Updated Vital Signs BP 120/74   Pulse 96   Temp 99 F (37.2 C) (Oral)   Resp 16   SpO2 98%   Physical Exam Vitals signs and nursing note reviewed.  Constitutional:      Appearance: He is not ill-appearing or toxic-appearing.  HENT:     Head: Normocephalic and atraumatic.     Nose: Nose normal.     Mouth/Throat:     Mouth: Mucous membranes are moist.     Pharynx: Oropharynx is clear.  Eyes:     Conjunctiva/sclera: Conjunctivae normal.  Neck:     Musculoskeletal: Normal range of motion.  Cardiovascular:     Rate and Rhythm: Normal rate and regular rhythm.     Pulses: Normal pulses.  Heart sounds: Normal heart sounds.  Pulmonary:     Effort: Pulmonary effort is normal.     Breath sounds: Normal breath sounds.  Abdominal:     General: There is no distension.     Palpations: Abdomen is soft.     Tenderness: There is abdominal tenderness in the epigastric area. There is no guarding or rebound.     Comments: No peritoneal signs.   Musculoskeletal: Normal range of motion.  Skin:    General: Skin is warm and dry.     Capillary Refill: Capillary refill takes less than 2 seconds.  Neurological:     Mental Status: He is alert. Mental status is at baseline.     Motor: No weakness.  Psychiatric:        Behavior: Behavior normal.      ED Treatments / Results  Labs (all labs ordered are listed, but only abnormal results are displayed) Labs Reviewed  COMPREHENSIVE METABOLIC PANEL - Abnormal; Notable for the following components:       Result Value   Glucose, Bld 107 (*)    All other components within normal limits  CBC - Abnormal; Notable for the following components:   MCH 25.0 (*)    All other components within normal limits  URINALYSIS, ROUTINE W REFLEX MICROSCOPIC - Abnormal; Notable for the following components:   APPearance HAZY (*)    All other components within normal limits  LIPASE, BLOOD    EKG None  Radiology No results found.  Procedures Procedures (including critical care time)  Medications Ordered in ED Medications  alum & mag hydroxide-simeth (MAALOX/MYLANTA) 200-200-20 MG/5ML suspension 30 mL (has no administration in time range)    And  lidocaine (XYLOCAINE) 2 % viscous mouth solution 15 mL (has no administration in time range)  sodium chloride flush (NS) 0.9 % injection 3 mL (3 mLs Intravenous Given 01/29/19 1723)     Initial Impression / Assessment and Plan / ED Course  I have reviewed the triage vital signs and the nursing notes.  Pertinent labs & imaging results that were available during my care of the patient were reviewed by me and considered in my medical decision making (see chart for details).   Patient has questionable Crohn's history.  He states that he was told he had Crohn's as a child and received treatment, but then never had any further issues or treatments in the last 10 years.  DDX includes gastritis, GERD, cholecystitis, appendicitis, Crohn's flare, biliary colic, less likely pancreatitis.  Will initiate work-up with abdominal labs and UA.  Patient's labs are unremarkable including CMP, CBC, lipase.  UA also unremarkable.  Will treat with GI cocktail and reassess.  Patient's abdominal pain did not improve after GI cocktail. Pt is denying pain medication.  CT abdomen pelvis shows distension of distal ileum and possible infectious/inflammatory process in terminal ileum. Also shows dilated, thick walled appendix that could be appendicitis or related to same process causing  involving terminal ileum. There is fecalization in distal ileum with decreased motility. Consulted surgery for possible appendicitis vs ileitis. Spoke with Dr Rosendo Gros who says this is less likely to be appendicitis given pt is afebrile, without leukocytosis, overall well appearing. Rosendo Gros evaluated the pt and suggest starting bowel regiment for constipation and outpatient follow up with GI for further evaluation.  Discussed strict ED return precautions. Pt verbalized understanding of and is in agreement with this plan. Pt stable for discharge home at this time.     Final Clinical  Impressions(s) / ED Diagnoses   Final diagnoses:  None    ED Discharge Orders    None       Flint Melter 01/30/19 1238    Valarie Merino, MD 02/06/19 1456

## 2019-01-31 ENCOUNTER — Emergency Department (HOSPITAL_COMMUNITY): Payer: BLUE CROSS/BLUE SHIELD

## 2019-01-31 ENCOUNTER — Inpatient Hospital Stay (HOSPITAL_COMMUNITY)
Admission: EM | Admit: 2019-01-31 | Discharge: 2019-02-02 | DRG: 387 | Disposition: A | Discharge status: Home / Self Care | Payer: BLUE CROSS/BLUE SHIELD | Attending: Internal Medicine | Admitting: Internal Medicine

## 2019-01-31 ENCOUNTER — Other Ambulatory Visit: Payer: Self-pay

## 2019-01-31 ENCOUNTER — Encounter (HOSPITAL_COMMUNITY): Payer: Self-pay

## 2019-01-31 DIAGNOSIS — R739 Hyperglycemia, unspecified: Secondary | ICD-10-CM | POA: Diagnosis present

## 2019-01-31 DIAGNOSIS — D72829 Elevated white blood cell count, unspecified: Secondary | ICD-10-CM | POA: Diagnosis not present

## 2019-01-31 DIAGNOSIS — Z87891 Personal history of nicotine dependence: Secondary | ICD-10-CM | POA: Diagnosis not present

## 2019-01-31 DIAGNOSIS — K529 Noninfective gastroenteritis and colitis, unspecified: Secondary | ICD-10-CM | POA: Diagnosis not present

## 2019-01-31 DIAGNOSIS — K50018 Crohn's disease of small intestine with other complication: Secondary | ICD-10-CM | POA: Diagnosis not present

## 2019-01-31 DIAGNOSIS — Z79899 Other long term (current) drug therapy: Secondary | ICD-10-CM | POA: Diagnosis not present

## 2019-01-31 DIAGNOSIS — R1031 Right lower quadrant pain: Secondary | ICD-10-CM | POA: Diagnosis present

## 2019-01-31 HISTORY — DX: Injury of conjunctiva and corneal abrasion without foreign body, unspecified eye, initial encounter: S05.00XA

## 2019-01-31 LAB — URINALYSIS, ROUTINE W REFLEX MICROSCOPIC
Bilirubin Urine: NEGATIVE
Glucose, UA: NEGATIVE mg/dL
Hgb urine dipstick: NEGATIVE
Ketones, ur: 5 mg/dL — AB
Leukocytes,Ua: NEGATIVE
Nitrite: NEGATIVE
Protein, ur: NEGATIVE mg/dL
Specific Gravity, Urine: 1.014 (ref 1.005–1.030)
pH: 7 (ref 5.0–8.0)

## 2019-01-31 LAB — COMPREHENSIVE METABOLIC PANEL
ALT: 13 U/L (ref 0–44)
AST: 21 U/L (ref 15–41)
Albumin: 4.1 g/dL (ref 3.5–5.0)
Alkaline Phosphatase: 64 U/L (ref 38–126)
Anion gap: 13 (ref 5–15)
BUN: 8 mg/dL (ref 6–20)
CO2: 25 mmol/L (ref 22–32)
Calcium: 9.4 mg/dL (ref 8.9–10.3)
Chloride: 100 mmol/L (ref 98–111)
Creatinine, Ser: 1.2 mg/dL (ref 0.61–1.24)
GFR calc Af Amer: >60 mL/min (ref >60)
GFR calc non Af Amer: >60 mL/min (ref >60)
Glucose, Bld: 132 mg/dL — ABNORMAL HIGH (ref 70–99)
Potassium: 4.4 mmol/L (ref 3.5–5.1)
Sodium: 138 mmol/L (ref 135–145)
Total Bilirubin: 1.4 mg/dL — ABNORMAL HIGH (ref 0.3–1.2)
Total Protein: 7.3 g/dL (ref 6.5–8.1)

## 2019-01-31 LAB — CBC
HCT: 46.5 % (ref 39.0–52.0)
Hemoglobin: 15.1 g/dL (ref 13.0–17.0)
MCH: 26.3 pg (ref 26.0–34.0)
MCHC: 32.5 g/dL (ref 30.0–36.0)
MCV: 80.9 fL (ref 80.0–100.0)
Platelets: 302 10*3/uL (ref 150–400)
RBC: 5.75 MIL/uL (ref 4.22–5.81)
RDW: 13.6 % (ref 11.5–15.5)
WBC: 12.4 10*3/uL — ABNORMAL HIGH (ref 4.0–10.5)
nRBC: 0 % (ref 0.0–0.2)

## 2019-01-31 LAB — C-REACTIVE PROTEIN: CRP: 0.8 mg/dL (ref <1.0)

## 2019-01-31 LAB — LIPASE, BLOOD: Lipase: 46 U/L (ref 11–51)

## 2019-01-31 LAB — LACTIC ACID, PLASMA: Lactic Acid, Venous: 1.4 mmol/L (ref 0.5–1.9)

## 2019-01-31 LAB — SEDIMENTATION RATE: Sed Rate: 9 mm/hr (ref 0–16)

## 2019-01-31 MED ORDER — ACETAMINOPHEN 325 MG PO TABS: 650.0000 mg | ORAL_TABLET | Freq: Four times a day (QID) | ORAL | Status: DC | PRN

## 2019-01-31 MED ORDER — IOHEXOL 300 MG/ML  SOLN
100.0000 mL | Freq: Once | INTRAMUSCULAR | Status: AC | PRN
  Administered 2019-01-31: 100 mL via INTRAVENOUS

## 2019-01-31 MED ORDER — SODIUM CHLORIDE 0.9 % IV BOLUS
1000.0000 mL | Freq: Once | INTRAVENOUS | Status: AC
  Administered 2019-01-31: 1000 mL via INTRAVENOUS

## 2019-01-31 MED ORDER — MORPHINE SULFATE (PF) 2 MG/ML IV SOLN: 2.0000 mg | INTRAVENOUS | Status: DC | PRN

## 2019-01-31 MED ORDER — ONDANSETRON HCL 4 MG/2ML IJ SOLN: 4.0000 mg | Freq: Four times a day (QID) | INTRAMUSCULAR | Status: DC | PRN

## 2019-01-31 MED ORDER — CIPROFLOXACIN IN D5W 400 MG/200ML IV SOLN
400.0000 mg | Freq: Two times a day (BID) | INTRAVENOUS | Status: DC
  Administered 2019-01-31 – 2019-02-01 (×2): 400 mg via INTRAVENOUS
  Filled 2019-01-31 (×2): qty 200

## 2019-01-31 MED ORDER — MORPHINE SULFATE (PF) 4 MG/ML IV SOLN
4.0000 mg | Freq: Once | INTRAVENOUS | Status: AC
  Administered 2019-01-31: 4 mg via INTRAVENOUS
  Filled 2019-01-31: qty 1

## 2019-01-31 MED ORDER — ONDANSETRON HCL 4 MG PO TABS: 4.0000 mg | ORAL_TABLET | Freq: Four times a day (QID) | ORAL | Status: DC | PRN

## 2019-01-31 MED ORDER — SODIUM CHLORIDE 0.9% FLUSH: 3.0000 mL | Freq: Once | INTRAVENOUS | Status: DC

## 2019-01-31 MED ORDER — METHYLPREDNISOLONE SODIUM SUCC 125 MG IJ SOLR
60.0000 mg | INTRAMUSCULAR | Status: DC
  Administered 2019-01-31 – 2019-02-01 (×2): 60 mg via INTRAVENOUS
  Filled 2019-01-31 (×2): qty 2

## 2019-01-31 MED ORDER — ONDANSETRON HCL 4 MG/2ML IJ SOLN
4.0000 mg | INTRAMUSCULAR | Status: DC | PRN
  Administered 2019-01-31: 4 mg via INTRAVENOUS
  Filled 2019-01-31: qty 2

## 2019-01-31 MED ORDER — ACETAMINOPHEN 650 MG RE SUPP: 650.0000 mg | Freq: Four times a day (QID) | RECTAL | Status: DC | PRN

## 2019-01-31 NOTE — ED Provider Notes (Signed)
[facility] EMERGENCY DEPARTMENT Provider Note   CSN: [id number] Arrival date & time: 01/31/19  7829     History   Chief Complaint Chief Complaint  Patient presents with  . Abdominal Pain    HPI James Ortiz is a 33 y.o. male is here for evaluation of abdominal pain.  Onset approximately 1.5 weeks ago, it began when he was diagnosed with influenza.  He describes periumbilical, epigastric abdominal pain that is constantly achy and intermittently worsens to a squeezing, sharp pain 8/10.  It intermittently radiates to the right mid abdomen and right lower quadrant.  It comes in waves.  He was seen in the ER 2 days ago and had a CT scan, general surgery evaluated him and was told he was likely constipated.  He has been compliant with MiraLAX but states the pain has worsened and is more frequent.  Associated with subjective chills, fevers up to 100, nausea, vomiting x1 nonbilious nonbloody today.  He is having watery nonbloody non-melanotic stools.  His last normal BM was over 1 week ago.  He is passing gas.  He is tolerating fluids and food and states the pain does not change with oral intake.  States when he was a child he was told he had Crohn's and had treatment for it but has not had any treatments, colonoscopies or GI follow-up for this in more than 10 years.  No abdominal surgeries.  Denies any dysuria, hematuria, melena, hematochezia, heavy EtOH or NSAID use, ulcer.  No aggravating/alleviating factors.   HPI  Past Medical History:  Diagnosis Date  . Chest pain   . Contusion of rib on left side   . RUQ pain   . Sinusitis     Patient Active Problem List   Diagnosis Date Noted  . CONTUSION, LEFT RIB 09/23/2010  . CHEST PAIN, RIGHT 07/19/2010  . RUQ PAIN 07/19/2010    Past Surgical History:  Procedure Laterality Date  . KNEE SURGERY    . NO SURGICAL HISTORY          Home Medications    Prior to Admission medications   Medication Sig Start Date  End Date Taking? Authorizing Provider  doxycycline (VIBRA-TABS) 100 MG tablet Take 100 mg by mouth daily.  01/13/19  Yes [provider]  finasteride (PROPECIA) 1 MG tablet Take 1 mg by mouth daily. Hair loss   Yes [provider]  Multiple Vitamins-Minerals (MULTIVITAMIN WITH MINERALS) tablet Take 1 tablet by mouth daily.   Yes [provider]  polyethylene glycol (MIRALAX / GLYCOLAX) packet Take 17 g by mouth daily.   Yes [provider]  HYDROcodone-acetaminophen (NORCO/VICODIN) 5-325 MG tablet Take 1-2 tablets every 6 (six) hours as needed by mouth. Patient not taking: Reported on 01/31/2019 10/22/17   Fransico Meadow, PA-C  tobramycin (TOBREX) 0.3 % ophthalmic solution Place 2 drops every 4 (four) hours into the left eye. Patient not taking: Reported on 01/31/2019 10/22/17   Sidney Ace    Family History Family History  Problem Relation Age of Onset  . Other Other        HEALTHY  . Other Other        HEALTHY  . Other Other        HEALTHY    Social History Social History   Tobacco Use  . Smoking status: Current Some Day Smoker  . Smokeless tobacco: Never Used  Substance Use Topics  . Alcohol use: Yes  .  Drug use: No     Allergies   Milk-related compounds   Review of Systems Review of Systems  Constitutional: Positive for chills and fever.  Gastrointestinal: Positive for abdominal pain, diarrhea, nausea and vomiting.  All other systems reviewed and are negative.    Physical Exam Updated Vital Signs BP 122/80   Pulse 78   Temp 99.4 F (37.4 C) (Oral)   Resp 16   SpO2 95%   Physical Exam Vitals signs and nursing note reviewed.  Constitutional:      Appearance: He is well-developed.     Comments: Non toxic.  HENT:     Head: Normocephalic and atraumatic.     Nose: Nose normal.     Mouth/Throat:     Comments: Moist mucous membranes Eyes:     Conjunctiva/sclera: Conjunctivae normal.     Pupils: Pupils are equal,  round, and reactive to light.  Neck:     Musculoskeletal: Normal range of motion.  Cardiovascular:     Rate and Rhythm: Normal rate and regular rhythm.     Heart sounds: Normal heart sounds.  Pulmonary:     Effort: Pulmonary effort is normal.     Breath sounds: Normal breath sounds.  Abdominal:     General: Abdomen is flat. Bowel sounds are normal.     Palpations: Abdomen is soft.     Tenderness: There is abdominal tenderness.     Comments: Soft.  Diffuse abdominal tenderness with significant.  Umbilical, epigastric, RLQ regions with guarding.  No distention, rigidity.  Active BS to lower quadrants.  No suprapubic or CVA tenderness.   Musculoskeletal: Normal range of motion.  Skin:    General: Skin is warm and dry.     Capillary Refill: Capillary refill takes less than 2 seconds.  Neurological:     Mental Status: He is alert and oriented to person, place, and time.  Psychiatric:        Behavior: Behavior normal.        Thought Content: Thought content normal.        Judgment: Judgment normal.      ED Treatments / Results  Labs (all labs ordered are listed, but only abnormal results are displayed) Labs Reviewed  COMPREHENSIVE METABOLIC PANEL - Abnormal; Notable for the following components:      Result Value   Glucose, Bld 132 (*)    Total Bilirubin 1.4 (*)    All other components within normal limits  CBC - Abnormal; Notable for the following components:   WBC 12.4 (*)    All other components within normal limits  URINALYSIS, ROUTINE W REFLEX MICROSCOPIC - Abnormal; Notable for the following components:   APPearance CLOUDY (*)    Ketones, ur 5 (*)    All other components within normal limits  LIPASE, BLOOD  LACTIC ACID, PLASMA  LACTIC ACID, PLASMA    EKG None  Radiology Ct Abdomen Pelvis W Contrast  Result Date: 01/31/2019 CLINICAL DATA:  Right lower quadrant pain, cramping, nausea, vomiting EXAM: CT ABDOMEN AND PELVIS WITH CONTRAST TECHNIQUE: Multidetector CT  imaging of the abdomen and pelvis was performed using the standard protocol following bolus administration of intravenous contrast. CONTRAST:  19mL OMNIPAQUE IOHEXOL 300 MG/ML  SOLN COMPARISON:  01/29/2019 FINDINGS: Lower chest: No acute abnormality. Hepatobiliary: No focal liver abnormality is seen. No gallstones, gallbladder wall thickening, or biliary dilatation. Pancreas: Unremarkable. No pancreatic ductal dilatation or surrounding inflammatory changes. Spleen: Normal in size without focal abnormality. Adrenals/Urinary Tract: Adrenal glands are  unremarkable. Kidneys are normal, without renal calculi, focal lesion, or hydronephrosis. Bladder is unremarkable. Stomach/Bowel: Stomach is within normal limits. Redemonstrated inflammation and thickening of the terminal ileum and ileocecal valve (series 6, image 69) with fecalized contents of the distal small bowel and dilatation of the ileum up to 4.9 cm. There is gas and stool present in the colon to the rectum. There is redemonstrated inflamed appearance of the appendix measuring up to 1.0 cm with air in the appendiceal lumen. Vascular/Lymphatic: No significant vascular findings are present. No enlarged abdominal or pelvic lymph nodes. Reproductive: No mass or other abnormality. Other: No abdominal wall hernia or abnormality. Small volume ascites in the low pelvis, new compared to prior examination. Musculoskeletal: No acute or significant osseous findings. IMPRESSION: 1. Redemonstrated inflammation and thickening of the terminal ileum and ileocecal valve (series 6, image 69) with fecalized contents of the distal small bowel and dilatation of the ileum up to 4.9 cm. There is gas and stool present in the colon to the rectum. Findings are generally similar to recent CT dated 01/29/2019 and highly suspicious for inflammatory bowel disease, specifically Crohn's disease, and complication by ileal stricture. 2. There is redemonstrated inflamed appearance of the appendix  measuring up to 1.0 cm with air in the appendiceal lumen. Generally favor reactive inflammation from adjacent bowel inflammation given the presence of air in the appendiceal lumen and no significant interval progression of findings. 3. Small volume ascites in the low pelvis, new compared to prior examination. Electronically Signed   By: Eddie Candle M.D.   On: 01/31/2019 09:39   Ct Abdomen Pelvis W Contrast  Result Date: 01/29/2019 CLINICAL DATA:  Severe generalized abdominal pain for 2 weeks with intermittent nausea and vomiting. EXAM: CT ABDOMEN AND PELVIS WITH CONTRAST TECHNIQUE: Multidetector CT imaging of the abdomen and pelvis was performed using the standard protocol following bolus administration of intravenous contrast. CONTRAST:  161mL OMNIPAQUE IOHEXOL 300 MG/ML  SOLN COMPARISON:  None. FINDINGS: Lower chest: Unremarkable. Hepatobiliary: No suspicious focal abnormality within the liver parenchyma. There is no evidence for gallstones, gallbladder wall thickening, or pericholecystic fluid. No intrahepatic or extrahepatic biliary dilation. Pancreas: No focal mass lesion. No dilatation of the main duct. No intraparenchymal cyst. No peripancreatic edema. Spleen: No splenomegaly. No focal mass lesion. Adrenals/Urinary Tract: No adrenal nodule or mass. Kidneys unremarkable. No evidence for hydroureter. The urinary bladder appears normal for the degree of distention. Stomach/Bowel: Stomach is unremarkable. No gastric wall thickening. No evidence of outlet obstruction. Duodenum is normally positioned as is the ligament of Treitz. No small bowel wall thickening. No small bowel dilatation. Distal and terminal ileum is dilated up to 4.6 cm diameter. Fecalization of enteric contents noted in the distal and terminal ileum. There is mucosal hyperenhancement in the terminal ileum extending into the ileocecal valve. The appendix is dilated up to 12 mm and is also hyperemic. Right colon is decompressed. No gross  colonic mass. No colonic wall thickening. Vascular/Lymphatic: No abdominal aortic aneurysm. No abdominal aortic atherosclerotic calcification. There is no gastrohepatic or hepatoduodenal ligament lymphadenopathy. No intraperitoneal or retroperitoneal lymphadenopathy. Increased number of lymph nodes are seen in the central small bowel and ileocolic mesentery. No pelvic sidewall lymphadenopathy. Reproductive: The prostate gland and seminal vesicles are unremarkable. Other: No intraperitoneal free fluid. Musculoskeletal: No worrisome lytic or sclerotic osseous abnormality. IMPRESSION: 1. The distal 20-30 cm of ileum is distended up to 4.5 cm diameter. This distended distal ileum shows fecalization of enteric contents consistent with decreased motility/transit. The  terminal ileum shows ab the normal enhancement in the mucosa that extends into the ileocecal valve with some apparent hyperenhancement in the adjacent cecum. Imaging features suggest infectious/inflammatory process in the terminal ileum/ileocecal valve with inflammatory stricture of the terminal ileum. 2. The appendix is dilated, thick walled, and hyperenhancing with surrounding periappendiceal edema/inflammation. As such, imaging features suggest acute appendicitis. However, the changes in the appendix do not appear to be the cause of the terminal ileal abnormality and while acute appendicitis can not be excluded by imaging, it is possible that the changes in the appendix are due to the same process involving the terminal ileum. 3. Increased number of upper normal lymph nodes identified in the ileocolic and central small bowel mesentery. 4. No intraperitoneal free fluid. No mesenteric or intraperitoneal abscess. Electronically Signed   By: Misty Stanley M.D.   On: 01/29/2019 20:15    Procedures Procedures (including critical care time)  Medications Ordered in ED Medications  sodium chloride flush (NS) 0.9 % injection 3 mL (0 mLs Intravenous Hold  01/31/19 0827)  ondansetron (ZOFRAN) injection 4 mg (4 mg Intravenous Given 01/31/19 0837)  sodium chloride 0.9 % bolus 1,000 mL (1,000 mLs Intravenous New Bag/Given 01/31/19 0838)  morphine 4 MG/ML injection 4 mg (4 mg Intravenous Given 01/31/19 0837)  iohexol (OMNIPAQUE) 300 MG/ML solution 100 mL (100 mLs Intravenous Contrast Given 01/31/19 0905)     Initial Impression / Assessment and Plan / ED Course  I have reviewed the triage vital signs and the nursing notes.  Pertinent labs & imaging results that were available during my care of the patient were reviewed by me and considered in my medical decision making (see chart for details).  Clinical Course as of Jan 31 1042  Thu Jan 31, 2019  0818 10.4 two days ago  WBC(!): 12.4 [CG]  0955 IMPRESSION: 1. Redemonstrated inflammation and thickening of the terminal ileum and ileocecal valve (series 6, image 69) with fecalized contents of the distal small bowel and dilatation of the ileum up to 4.9 cm. There is gas and stool present in the colon to the rectum. Findings are generally similar to recent CT dated 01/29/2019 and highly suspicious for inflammatory bowel disease, specifically Crohn's disease, and complication by ileal stricture.  2. There is redemonstrated inflamed appearance of the appendix measuring up to 1.0 cm with air in the appendiceal lumen. Generally favor reactive inflammation from adjacent bowel inflammation given the presence of air in the appendiceal lumen and no significant interval progression of findings.  3. Small volume ascites in the low pelvis, new compared to prior examination.   CT ABDOMEN PELVIS W CONTRAST [CG]  1017 Spoke to Sarah with GI, recommends admission to medicine team, supportive care, sips of fluid/ice chips.  GI will see patient and provide further recommendations.    [CG]    Clinical Course User Index [CG] Kinnie Feil, PA-C    Differential diagnosis includes colitis versus colitis with  abscess/perforation vs obstruction vs appendicitis vs diverticulitis.  I reviewed patient's ER visit 2 days ago, at that time CT A/P showed distal ileum dilation and hyperenhancement suggestive of infectious/inflammatory process and possible stricture, appendix dilation reactive lymph nodes in the small bowel mesentery.    Mild leukocytosis.  He is tachycardic.  No fever.  LFTs and creatinine unremarkable.  We will obtain CT A/P for further evaluation.  Ortiz fluids, Zofran, morphine ordered.  Plan to reassess.  1035: CTAP suggestive of active IBD with stricture. Given persistent symptoms, leukocytosis,  GI recommending admission. Awaiting formal GI eval and recommendations. Updated pt who is in agreement. He used to f/u with Dr Thana Farr.  Final Clinical Impressions(s) / ED Diagnoses   Final diagnoses:  IBD (inflammatory bowel disease)    ED Discharge Orders    None       Kinnie Feil, PA-C 01/31/19 1043    Virgel Manifold, MD 02/01/19 [phone]

## 2019-01-31 NOTE — ED Triage Notes (Signed)
Pt states he was seen here 2 days ago for constipation and was told to return with severe abdominal pain, nausea, persistent vomiting. Pt does appear pale in color.

## 2019-01-31 NOTE — Consult Note (Addendum)
Consultation  Referring Provider: Dr. Lorin Mercy     Primary Care Physician:  Patient, No Pcp Per Primary Gastroenterologist: Althia Forts        Reason for Consultation:   Abdominal pain         HPI:   James Ortiz is a 33 y.o. male with a past medical history as listed below, who presented to the ER today with a complaint of abdominal pain.    Patient seen in the ER 2 days ago with a CT scan.  General surgery evaluated him and thought he was likely constipated.  He was started on MiraLAX.    Today, patient explains that about 1-1/2 weeks ago he started with some abdominal pain after being diagnosed with influenza.  He describes this pain as periumbilical/epigastric, constant and achy.  It intermittently worsened to a "squeezing" sharp pain, rated as 8-9/10, and will sometimes radiate to his right mid abdomen and right lower quadrant.  Explains now that he has been taking MiraLAX since being seen 2 days ago in the ER but feels as though the pain has worsened and is more frequent.  He is also not been on a very high-fiber diet per their recommendations which she feels like is made things worse.  This morning after eating some blueberries he probably vomited them all up.  His last bowel movements were yesterday and or at least 4 times but were all very small and mostly liquid/pieces.  He has been passing gas today and has no further nausea or vomiting.  Continues with abdominal pain.  Tells me he feels "full up".  Associated with subjective chills and fevers up to 100. Describes his last normal bowel movement was more than a week ago.  Did also see some bright red blood when he was wiping yesterday after a bowel movement.    Describes history of following with Dr. Milderd Meager in the past for Crohn's when he was in high school.  It sounds as though he was on oral medications, but has not been on these for years and has not followed with a gastroenterologist recently.    Denies heartburn or reflux.  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  Past Medical History:  Diagnosis Date  . Chest pain   . Contusion of rib on left side   . RUQ pain   . Sinusitis     Past Surgical History:  Procedure Laterality Date  . KNEE SURGERY    . NO SURGICAL HISTORY      Family History  Problem Relation Age of Onset  . Other Other        HEALTHY  . Other Other        HEALTHY  . Other Other        HEALTHY    Social History   Tobacco Use  . Smoking status: Current Some Day Smoker  . Smokeless tobacco: Never Used  Substance Use  Topics  . Alcohol use: Yes  . Drug use: No    Prior to Admission medications   Medication Sig Start Date End Date Taking? Authorizing Provider  doxycycline (VIBRA-TABS) 100 MG tablet Take 100 mg by mouth daily.  01/13/19  Yes [provider]  finasteride (PROPECIA) 1 MG tablet Take 1 mg by mouth daily. Hair loss   Yes [provider]  Multiple Vitamins-Minerals (MULTIVITAMIN WITH MINERALS) tablet Take 1 tablet by mouth daily.   Yes [provider]  polyethylene glycol (MIRALAX / GLYCOLAX) packet Take 17 g by mouth daily.   Yes [provider]  HYDROcodone-acetaminophen (NORCO/VICODIN) 5-325 MG tablet Take 1-2 tablets every 6 (six) hours as needed by mouth. Patient not taking: Reported on 01/31/2019 10/22/17   Fransico Meadow, PA-C  tobramycin (TOBREX) 0.3 % ophthalmic solution Place 2  drops every 4 (four) hours into the left eye. Patient not taking: Reported on 01/31/2019 10/22/17   Fransico Meadow, PA-C    Current Facility-Administered Medications  Medication Dose Route Frequency Provider Last Rate Last Dose  . ondansetron (ZOFRAN) injection 4 mg  4 mg Intravenous PRN Kinnie Feil, PA-C   4 mg at 01/31/19 8657  . sodium chloride flush (NS) 0.9 % injection 3 mL  3 mL Intravenous Once Virgel Manifold, MD   Stopped at 01/31/19 0827   Current Outpatient Medications  Medication Sig Dispense Refill  . doxycycline (VIBRA-TABS) 100 MG tablet Take 100 mg by mouth daily.     . finasteride (PROPECIA) 1 MG tablet Take 1 mg by mouth daily. Hair loss    . Multiple Vitamins-Minerals (MULTIVITAMIN WITH MINERALS) tablet Take 1 tablet by mouth daily.    . polyethylene glycol (MIRALAX / GLYCOLAX) packet Take 17 g by mouth daily.    Marland Kitchen HYDROcodone-acetaminophen (NORCO/VICODIN) 5-325 MG tablet Take 1-2 tablets every 6 (six) hours as needed by mouth. (Patient not taking: Reported on 01/31/2019) 10 tablet 0  . tobramycin (TOBREX) 0.3 % ophthalmic solution Place 2 drops every 4 (four) hours into the left eye. (Patient not taking: Reported on 01/31/2019) 5 mL 0    Allergies as of 01/31/2019 - Review Complete 01/31/2019  Allergen Reaction Noted  . Milk-related compounds       Review of Systems:    Constitutional: No weight loss Skin: No rash  Cardiovascular: No chest pain Respiratory: No SOB Gastrointestinal: See HPI and otherwise negative Genitourinary: No dysuria  Neurological: No headache, dizziness or syncope Musculoskeletal: No new muscle or joint pain Hematologic: No  bruising Psychiatric: No history of depression or anxiety    Physical Exam:  Vital signs in last 24 hours: Temp:  [99.4 F (37.4 C)] 99.4 F (37.4 C) (02/13 0655) Pulse Rate:  [78-112] 78 (02/13 0815) Resp:  [16] 16 (02/13 0655) BP: (118-122)/(80-99) 122/80 (02/13 0815) SpO2:  [95 %] 95 % (02/13 0815)     General:   Pleasant Caucasian male appears to be in NAD, Well developed, Well nourished, alert and cooperative Head:  Normocephalic and atraumatic. Eyes:   PEERL, EOMI. No icterus. Conjunctiva pink. Ears:  Normal auditory acuity. Neck:  Supple Throat: Oral cavity and pharynx without inflammation, swelling or lesion. Lungs: Respirations even and unlabored. Lungs clear to auscultation bilaterally.   No wheezes, crackles, or rhonchi.  Heart: Normal S1, S2. No MRG. Regular rate and rhythm. No peripheral edema, cyanosis or pallor.  Abdomen:  Soft, nondistended, moderate generalized TTP. No rebound or guarding.  Decreased bowel sounds all 4 quadrants, no  appreciable masses or hepatomegaly. Rectal:  Not performed.  Msk:  Symmetrical without gross deformities. Peripheral pulses intact.  Extremities:  Without edema, no deformity or joint abnormality. Neurologic:  Alert and  oriented x4;  grossly normal neurologically.  Skin:   Dry and intact without significant lesions or rashes. Psychiatric: Demonstrates good judgement and reason without abnormal affect or behaviors.   LAB RESULTS: Recent Labs    01/29/19 1624 01/31/19 0723  WBC 10.4 12.4*  HGB 13.6 15.1  HCT 44.7 46.5  PLT 259 302   BMET Recent Labs    01/29/19 1624 01/31/19 0723  NA 140 138  K 4.0 4.4  CL 105 100  CO2 28 25  GLUCOSE 107* 132*  BUN 11 8  CREATININE 1.00 1.20  CALCIUM 8.9 9.4   LFT Recent Labs    01/31/19 0723  PROT 7.3  ALBUMIN 4.1  AST 21  ALT 13  ALKPHOS 64  BILITOT 1.4*   STUDIES: Ct Abdomen Pelvis W Contrast  Result Date: 01/31/2019 CLINICAL DATA:  Right lower quadrant pain, cramping, nausea, vomiting EXAM: CT ABDOMEN AND PELVIS WITH CONTRAST TECHNIQUE: Multidetector CT imaging of the abdomen and pelvis was performed using the standard protocol following bolus administration of intravenous contrast. CONTRAST:  137mL OMNIPAQUE IOHEXOL 300 MG/ML  SOLN COMPARISON:  01/29/2019 FINDINGS: Lower chest:  No acute abnormality. Hepatobiliary: No focal liver abnormality is seen. No gallstones, gallbladder wall thickening, or biliary dilatation. Pancreas: Unremarkable. No pancreatic ductal dilatation or surrounding inflammatory changes. Spleen: Normal in size without focal abnormality. Adrenals/Urinary Tract: Adrenal glands are unremarkable. Kidneys are normal, without renal calculi, focal lesion, or hydronephrosis. Bladder is unremarkable. Stomach/Bowel: Stomach is within normal limits. Redemonstrated inflammation and thickening of the terminal ileum and ileocecal valve (series 6, image 69) with fecalized contents of the distal small bowel and dilatation of the ileum up to 4.9 cm. There is gas and stool present in the colon to the rectum. There is redemonstrated inflamed appearance of the appendix measuring up to 1.0 cm with air in the appendiceal lumen. Vascular/Lymphatic: No significant vascular findings are present. No enlarged abdominal or pelvic lymph nodes. Reproductive: No mass or other abnormality. Other: No abdominal wall hernia or abnormality. Small volume ascites in the low pelvis, new compared to prior examination. Musculoskeletal: No acute or significant osseous findings. IMPRESSION: 1. Redemonstrated inflammation and thickening of the terminal ileum and ileocecal valve (series 6, image 69) with fecalized contents of the distal small bowel and dilatation of the ileum up to 4.9 cm. There is gas and stool present in the colon to the rectum. Findings are generally similar to recent CT dated 01/29/2019 and highly suspicious for inflammatory bowel disease, specifically Crohn's disease, and complication by ileal stricture. 2. There is redemonstrated inflamed appearance of the appendix measuring up to 1.0 cm with air in the appendiceal lumen. Generally favor reactive inflammation from adjacent bowel inflammation given the presence of air in the appendiceal lumen and no significant interval progression of  findings. 3. Small volume ascites in the low pelvis, new compared to prior examination. Electronically Signed   By: Eddie Candle M.D.   On: 01/31/2019 09:39   Ct Abdomen Pelvis W Contrast  Result Date: 01/29/2019 CLINICAL DATA:  Severe generalized abdominal pain for 2 weeks with intermittent nausea and vomiting. EXAM: CT ABDOMEN AND PELVIS WITH CONTRAST TECHNIQUE: Multidetector CT imaging of the abdomen and pelvis was performed using the standard protocol following bolus administration of intravenous contrast. CONTRAST:  195mL OMNIPAQUE IOHEXOL 300  MG/ML  SOLN COMPARISON:  None. FINDINGS: Lower chest: Unremarkable. Hepatobiliary: No suspicious focal abnormality within the liver parenchyma. There is no evidence for gallstones, gallbladder wall thickening, or pericholecystic fluid. No intrahepatic or extrahepatic biliary dilation. Pancreas: No focal mass lesion. No dilatation of the main duct. No intraparenchymal cyst. No peripancreatic edema. Spleen: No splenomegaly. No focal mass lesion. Adrenals/Urinary Tract: No adrenal nodule or mass. Kidneys unremarkable. No evidence for hydroureter. The urinary bladder appears normal for the degree of distention. Stomach/Bowel: Stomach is unremarkable. No gastric wall thickening. No evidence of outlet obstruction. Duodenum is normally positioned as is the ligament of Treitz. No small bowel wall thickening. No small bowel dilatation. Distal and terminal ileum is dilated up to 4.6 cm diameter. Fecalization of enteric contents noted in the distal and terminal ileum. There is mucosal hyperenhancement in the terminal ileum extending into the ileocecal valve. The appendix is dilated up to 12 mm and is also hyperemic. Right colon is decompressed. No gross colonic mass. No colonic wall thickening. Vascular/Lymphatic: No abdominal aortic aneurysm. No abdominal aortic atherosclerotic calcification. There is no gastrohepatic or hepatoduodenal ligament lymphadenopathy. No  intraperitoneal or retroperitoneal lymphadenopathy. Increased number of lymph nodes are seen in the central small bowel and ileocolic mesentery. No pelvic sidewall lymphadenopathy. Reproductive: The prostate gland and seminal vesicles are unremarkable. Other: No intraperitoneal free fluid. Musculoskeletal: No worrisome lytic or sclerotic osseous abnormality. IMPRESSION: 1. The distal 20-30 cm of ileum is distended up to 4.5 cm diameter. This distended distal ileum shows fecalization of enteric contents consistent with decreased motility/transit. The terminal ileum shows ab the normal enhancement in the mucosa that extends into the ileocecal valve with some apparent hyperenhancement in the adjacent cecum. Imaging features suggest infectious/inflammatory process in the terminal ileum/ileocecal valve with inflammatory stricture of the terminal ileum. 2. The appendix is dilated, thick walled, and hyperenhancing with surrounding periappendiceal edema/inflammation. As such, imaging features suggest acute appendicitis. However, the changes in the appendix do not appear to be the cause of the terminal ileal abnormality and while acute appendicitis can not be excluded by imaging, it is possible that the changes in the appendix are due to the same process involving the terminal ileum. 3. Increased number of upper normal lymph nodes identified in the ileocolic and central small bowel mesentery. 4. No intraperitoneal free fluid. No mesenteric or intraperitoneal abscess. Electronically Signed   By: Misty Stanley M.D.   On: 01/29/2019 20:15    Impression / Plan:   Impression: 1.  Abdominal pain: With some small loose stools, CT suggesting ileal stricture from Crohn's with dilation of the appendix up to 12 mm in night lesion of the ileum to 4.5 cm; most likely stricturing Crohn's with partial bowel obstruction 2.  Abnormal CT of the abdomen: Suggestive of Crohn's disease  Plan:  1.  Patient to remain on just sips and  chips for now, otherwise n.p.o. 2.  Will start solumederol 60mg  qd Ortiz 3.  At some point patient will need a colonoscopy 4.  Please await any further recommendations from Dr. Havery Moros later today  Thank you for your kind consultation, we will continue to follow.  Lavone Nian [facility]  01/31/2019, 11:04 AM

## 2019-01-31 NOTE — ED Notes (Signed)
1st attempt to call report to [id number], RN in restroom with another patient.  Gave direct number to call this RN back.

## 2019-01-31 NOTE — H&P (Signed)
History and Physical    James Ortiz JAS:[id number] DOB: Mar 30, 1986 DOA: 01/31/2019  PCP: Patient, No Pcp Per Consultants:  None Patient coming from:  Home - lives with wife and 69modaughter; NDonald Prose Wife, [phone] Chief Complaint: Abdominal pain  HPI: James Ortiz is a 33y.o. male with no significant past medical history presenting with abdominal pain.  He has been having abdominal pain for about 10 days.  He was seen 2 days ago and given Miralax and was told if he had any of some specific symptoms came back he should return.  He had been seen by UC for "flu" prior and he didn't get better.  A few weekends prior, he had emesis while working in [employer]  He had a similar episode in January.  He had similar pain and emesis then.    About 1-15 years ago, he had been evaluated for Crohn's.  He was on multiple medications and eventually he stopped taking medications and he had no further problems.  He was lost to follow up.     ED Course:  CT with IBD with ileal stricture.  As a child he was diagnosed with IBD; he was diagnosed as cured and was taken off meds 10 years ago.  Now with recurrent symptoms.  Seen in ER 2 days ago - CT with ileal infllammation and dilation.  Gen surg saw and thought it was constipation and he was discharged on Miralax.  Repeat CT positive.  WBC 12.4.  GI to see.  Recommend clear fluids for now.  Review of Systems: As per HPI; otherwise review of systems reviewed and negative.   Ambulatory Status:  Ambulates without assistance  Past Medical History:  Diagnosis Date  . Chest pain   . Contusion of rib on left side   . Corneal abrasion    non-healing, wore a permanent contact and finally removed yesterday  . RUQ pain   . Sinusitis     Past Surgical History:  Procedure Laterality Date  . KNEE SURGERY      Social History   Socioeconomic History  . Marital status: Single    Spouse name: Not on file  . Number of children: Not on file  . Years  of education: Not on file  . Highest education level: Not on file  Occupational History  . Occupation: [employer] Social Needs  . Financial resource strain: Not on file  . Food insecurity:    Worry: Not on file    Inability: Not on file  . Transportation needs:    Medical: Not on file    Non-medical: Not on file  Tobacco Use  . Smoking status: Former Smoker    Packs/day: 1.00    Years: 12.00    Pack years: 12.00  . Smokeless tobacco: Never Used  Substance and Sexual Activity  . Alcohol use: Yes    Comment: h/o heavy use  . Drug use: No  . Sexual activity: Not on file  Lifestyle  . Physical activity:    Days per week: Not on file    Minutes per session: Not on file  . Stress: Not on file  Relationships  . Social connections:    Talks on phone: Not on file    Gets together: Not on file    Attends religious service: Not on file    Active member of club or organization: Not on file    Attends meetings of clubs or organizations: Not on file  Relationship status: Not on file  . Intimate partner violence:    Fear of current or ex partner: Not on file    Emotionally abused: Not on file    Physically abused: Not on file    Forced sexual activity: Not on file  Other Topics Concern  . Not on file  Social History Narrative  . Not on file    Allergies  Allergen Reactions  . Milk-Related Compounds     Family History  Problem Relation Age of Onset  . Crohn's disease Father   . Other Other        HEALTHY  . Other Other        HEALTHY  . Other Other        HEALTHY    Prior to Admission medications   Medication Sig Start Date End Date Taking? Authorizing Provider  doxycycline (VIBRA-TABS) 100 MG tablet Take 100 mg by mouth daily.  01/13/19  Yes [provider]  finasteride (PROPECIA) 1 MG tablet Take 1 mg by mouth daily. Hair loss   Yes [provider]  Multiple Vitamins-Minerals (MULTIVITAMIN WITH MINERALS) tablet Take 1 tablet by mouth daily.    Yes [provider]  polyethylene glycol (MIRALAX / GLYCOLAX) packet Take 17 g by mouth daily.   Yes [provider]  HYDROcodone-acetaminophen (NORCO/VICODIN) 5-325 MG tablet Take 1-2 tablets every 6 (six) hours as needed by mouth. Patient not taking: Reported on 01/31/2019 10/22/17   Fransico Meadow, PA-C  tobramycin (TOBREX) 0.3 % ophthalmic solution Place 2 drops every 4 (four) hours into the left eye. Patient not taking: Reported on 01/31/2019 10/22/17   Sidney Ace    Physical Exam: Vitals:   01/31/19 0655 01/31/19 0815 01/31/19 1131 01/31/19 1528  BP: (!) 118/99 122/80 (!) 102/58 107/62  Pulse: (!) 112 78 68 64  Resp: 16  16 16   Temp: 99.4 F (37.4 C)  98.3 F (36.8 C) 98.1 F (36.7 C)  TempSrc: Oral  Oral Oral  SpO2: 95% 95% 98% 97%     . General:  Appears calm and comfortable and is NAD . Eyes:   EOMI, normal lids, iris . ENT:  grossly normal hearing, lips & tongue, mmm; appropriate dentition . Neck:  no LAD, masses or thyromegaly . Cardiovascular:  RRR, no m/r/g. No LE edema.  Marland Kitchen Respiratory:   CTA bilaterally with no wheezes/rales/rhonchi.  Normal respiratory effort. . Abdomen:  soft, tender along RLQ > RUQ, ND, NABS . Back:   normal alignment, no CVAT . Skin:  no rash or induration seen on limited exam . Musculoskeletal:  grossly normal tone BUE/BLE, good ROM, no bony abnormality . Psychiatric:  grossly normal mood and affect, speech fluent and appropriate, AOx3 . Neurologic:  CN 2-12 grossly intact, moves all extremities in coordinated fashion, sensation intact    Radiological Exams on Admission: Ct Abdomen Pelvis W Contrast  Result Date: 01/31/2019 CLINICAL DATA:  Right lower quadrant pain, cramping, nausea, vomiting EXAM: CT ABDOMEN AND PELVIS WITH CONTRAST TECHNIQUE: Multidetector CT imaging of the abdomen and pelvis was performed using the standard protocol following bolus administration of intravenous contrast. CONTRAST:  129m  OMNIPAQUE IOHEXOL 300 MG/ML  SOLN COMPARISON:  01/29/2019 FINDINGS: Lower chest: No acute abnormality. Hepatobiliary: No focal liver abnormality is seen. No gallstones, gallbladder wall thickening, or biliary dilatation. Pancreas: Unremarkable. No pancreatic ductal dilatation or surrounding inflammatory changes. Spleen: Normal in size without focal abnormality. Adrenals/Urinary Tract: Adrenal glands are unremarkable. Kidneys are normal,  without renal calculi, focal lesion, or hydronephrosis. Bladder is unremarkable. Stomach/Bowel: Stomach is within normal limits. Redemonstrated inflammation and thickening of the terminal ileum and ileocecal valve (series 6, image 69) with fecalized contents of the distal small bowel and dilatation of the ileum up to 4.9 cm. There is gas and stool present in the colon to the rectum. There is redemonstrated inflamed appearance of the appendix measuring up to 1.0 cm with air in the appendiceal lumen. Vascular/Lymphatic: No significant vascular findings are present. No enlarged abdominal or pelvic lymph nodes. Reproductive: No mass or other abnormality. Other: No abdominal wall hernia or abnormality. Small volume ascites in the low pelvis, new compared to prior examination. Musculoskeletal: No acute or significant osseous findings. IMPRESSION: 1. Redemonstrated inflammation and thickening of the terminal ileum and ileocecal valve (series 6, image 69) with fecalized contents of the distal small bowel and dilatation of the ileum up to 4.9 cm. There is gas and stool present in the colon to the rectum. Findings are generally similar to recent CT dated 01/29/2019 and highly suspicious for inflammatory bowel disease, specifically Crohn's disease, and complication by ileal stricture. 2. There is redemonstrated inflamed appearance of the appendix measuring up to 1.0 cm with air in the appendiceal lumen. Generally favor reactive inflammation from adjacent bowel inflammation given the presence of  air in the appendiceal lumen and no significant interval progression of findings. 3. Small volume ascites in the low pelvis, new compared to prior examination. Electronically Signed   By: Eddie Candle M.D.   On: 01/31/2019 09:39   Ct Abdomen Pelvis W Contrast  Result Date: 01/29/2019 CLINICAL DATA:  Severe generalized abdominal pain for 2 weeks with intermittent nausea and vomiting. EXAM: CT ABDOMEN AND PELVIS WITH CONTRAST TECHNIQUE: Multidetector CT imaging of the abdomen and pelvis was performed using the standard protocol following bolus administration of intravenous contrast. CONTRAST:  130m OMNIPAQUE IOHEXOL 300 MG/ML  SOLN COMPARISON:  None. FINDINGS: Lower chest: Unremarkable. Hepatobiliary: No suspicious focal abnormality within the liver parenchyma. There is no evidence for gallstones, gallbladder wall thickening, or pericholecystic fluid. No intrahepatic or extrahepatic biliary dilation. Pancreas: No focal mass lesion. No dilatation of the main duct. No intraparenchymal cyst. No peripancreatic edema. Spleen: No splenomegaly. No focal mass lesion. Adrenals/Urinary Tract: No adrenal nodule or mass. Kidneys unremarkable. No evidence for hydroureter. The urinary bladder appears normal for the degree of distention. Stomach/Bowel: Stomach is unremarkable. No gastric wall thickening. No evidence of outlet obstruction. Duodenum is normally positioned as is the ligament of Treitz. No small bowel wall thickening. No small bowel dilatation. Distal and terminal ileum is dilated up to 4.6 cm diameter. Fecalization of enteric contents noted in the distal and terminal ileum. There is mucosal hyperenhancement in the terminal ileum extending into the ileocecal valve. The appendix is dilated up to 12 mm and is also hyperemic. Right colon is decompressed. No gross colonic mass. No colonic wall thickening. Vascular/Lymphatic: No abdominal aortic aneurysm. No abdominal aortic atherosclerotic calcification. There is no  gastrohepatic or hepatoduodenal ligament lymphadenopathy. No intraperitoneal or retroperitoneal lymphadenopathy. Increased number of lymph nodes are seen in the central small bowel and ileocolic mesentery. No pelvic sidewall lymphadenopathy. Reproductive: The prostate gland and seminal vesicles are unremarkable. Other: No intraperitoneal free fluid. Musculoskeletal: No worrisome lytic or sclerotic osseous abnormality. IMPRESSION: 1. The distal 20-30 cm of ileum is distended up to 4.5 cm diameter. This distended distal ileum shows fecalization of enteric contents consistent with decreased motility/transit. The terminal ileum shows ab  the normal enhancement in the mucosa that extends into the ileocecal valve with some apparent hyperenhancement in the adjacent cecum. Imaging features suggest infectious/inflammatory process in the terminal ileum/ileocecal valve with inflammatory stricture of the terminal ileum. 2. The appendix is dilated, thick walled, and hyperenhancing with surrounding periappendiceal edema/inflammation. As such, imaging features suggest acute appendicitis. However, the changes in the appendix do not appear to be the cause of the terminal ileal abnormality and while acute appendicitis can not be excluded by imaging, it is possible that the changes in the appendix are due to the same process involving the terminal ileum. 3. Increased number of upper normal lymph nodes identified in the ileocolic and central small bowel mesentery. 4. No intraperitoneal free fluid. No mesenteric or intraperitoneal abscess. Electronically Signed   By: Misty Stanley M.D.   On: 01/29/2019 20:15    EKG: not done   Labs on Admission: I have personally reviewed the available labs and imaging studies at the time of the admission.  Pertinent labs:   Glucose 132 Bili 1.4 Lactate 1.4 WBC 12.4 UA: 5 ketones, otherwise WNL   Assessment/Plan Principal Problem:   Inflammatory bowel disease Active Problems:    Hyperglycemia   IBD -Patient with prior reported h/o IBD; appears to have weaned himself off medications and been lost to f/u for about 10 years without significant issues -He had symptoms in January and then more recently -He was seen in the ER on 2/11 and CT showed distention of the distal ileum and possible infectious/inflammatory process in the TI -Surgery evaluated the patient and thought he had constipation; they started Miralax and recommended outpatient GI f/u -After d/c, he has had ongoing abdominal pain with n/v and inability to have a BM -CT today is quite concerning for Crohn's flare with ileal stricture -Will admit to [place]  -Will consult GI to assist with management  -Morphine for pain control, Zofran for nausea when necessary l. - Empirically treat for Crohn's flare with cipro and intravenous steroids (solumedrol 60 mg daily).  -Check sed rate, c-reactive protein.  -NPO other than sips and chips for now, as per GI  Hyperglycemia -May be stress response -Will follow with fasting AM labs -It is unlikely that he will need acute or chronic treatment for this issue at this time   DVT prophylaxis: Early ambulation Code Status:  Full Family Communication: Wife present throughout evaluation  Disposition Plan:  Home once clinically improved Consults called: GI  Admission status: Admit - It is my clinical opinion that admission to INPATIENT is reasonable and necessary because of the expectation that this patient will require hospital care that crosses at least 2 midnights to treat this condition based on the medical complexity of the problems presented.  Given the aforementioned information, the predictability of an adverse outcome is felt to be significant.     Karmen Bongo MD [facility]   How to contact the [facility] Attending or Consulting provider [place] or covering provider during after hours [place], for this patient?  1. Check the care team in [facility] and look for a)  attending/consulting [facility] provider listed and b) the [facility] team listed 2. Log into [email or web address] and use [place] universal password to access. If you do not have the password, please contact the hospital operator. 3. Locate the [facility] provider you are looking for under [facility] and page to a number that you can be directly reached. 4. If you still have difficulty reaching the provider, please  page the [facility] (Director on Call) for the Hospitalists listed on amion for assistance.   01/31/2019, 5:38 PM

## 2019-02-01 DIAGNOSIS — D72829 Elevated white blood cell count, unspecified: Secondary | ICD-10-CM

## 2019-02-01 DIAGNOSIS — K50018 Crohn's disease of small intestine with other complication: Principal | ICD-10-CM

## 2019-02-01 LAB — BASIC METABOLIC PANEL
Anion gap: 8 (ref 5–15)
BUN: 12 mg/dL (ref 6–20)
CALCIUM: 9.2 mg/dL (ref 8.9–10.3)
CO2: 25 mmol/L (ref 22–32)
Chloride: 106 mmol/L (ref 98–111)
Creatinine, Ser: 0.99 mg/dL (ref 0.61–1.24)
GFR calc Af Amer: >60 mL/min (ref >60)
GFR calc non Af Amer: >60 mL/min (ref >60)
Glucose, Bld: 108 mg/dL — ABNORMAL HIGH (ref 70–99)
Potassium: 4 mmol/L (ref 3.5–5.1)
Sodium: 139 mmol/L (ref 135–145)

## 2019-02-01 LAB — CBC
HCT: 42.2 % (ref 39.0–52.0)
Hemoglobin: 13.3 g/dL (ref 13.0–17.0)
MCH: 25.3 pg — ABNORMAL LOW (ref 26.0–34.0)
MCHC: 31.5 g/dL (ref 30.0–36.0)
MCV: 80.2 fL (ref 80.0–100.0)
Platelets: 288 10*3/uL (ref 150–400)
RBC: 5.26 MIL/uL (ref 4.22–5.81)
RDW: 13.4 % (ref 11.5–15.5)
WBC: 9.5 10*3/uL (ref 4.0–10.5)
nRBC: 0 % (ref 0.0–0.2)

## 2019-02-01 LAB — HIV ANTIBODY (ROUTINE TESTING W REFLEX): HIV SCREEN 4TH GENERATION: NONREACTIVE

## 2019-02-01 MED ORDER — ENOXAPARIN SODIUM 40 MG/0.4ML ~~LOC~~ SOLN
40.0000 mg | SUBCUTANEOUS | Status: DC
  Administered 2019-02-01: 40 mg via SUBCUTANEOUS
  Filled 2019-02-01: qty 0.4

## 2019-02-01 NOTE — Progress Notes (Addendum)
Progress Note   Subjective  Chief Complaint: Crohn's disease, abdominal pain, nausea and vomiting   Today, patient tells me he feels much better, no further abdominal pain, nausea or vomiting.  He tolerated a clear liquid diet last night and has had 3 solid mostly normal bowel movements, the last this morning   Objective   Vital signs in last 24 hours: Temp:  [98.1 F (36.7 C)-98.5 F (36.9 C)] 98.5 F (36.9 C) (02/14 0534) Pulse Rate:  [51-68] 51 (02/14 0534) Resp:  [16-17] 17 (02/13 2146) BP: (102-117)/(55-62) 117/55 (02/14 0534) SpO2:  [97 %-98 %] 98 % (02/14 0534) Last BM Date: 01/31/19 General:    white male in NAD Heart:  Regular rate and rhythm; no murmurs Lungs: Respirations even and unlabored, lungs CTA bilaterally Abdomen:  Soft, mild RLQ ttp and nondistended. Normal bowel sounds. Extremities:  Without edema. Neurologic:  Alert and oriented,  grossly normal neurologically. Psych:  Cooperative. Normal mood and affect.  Intake/Output from previous day: 02/13 0701 - 02/14 0700 In: 1593.2 [P.O.:240; IV Piggyback:1353.2] Out: -   Lab Results: Recent Labs    01/29/19 1624 01/31/19 0723 02/01/19 0452  WBC 10.4 12.4* 9.5  HGB 13.6 15.1 13.3  HCT 44.7 46.5 42.2  PLT 259 302 288   BMET Recent Labs    01/29/19 1624 01/31/19 0723 02/01/19 0452  NA 140 138 139  K 4.0 4.4 4.0  CL 105 100 106  CO2 28 25 25   GLUCOSE 107* 132* 108*  BUN 11 8 12   CREATININE 1.00 1.20 0.99  CALCIUM 8.9 9.4 9.2   LFT Recent Labs    01/31/19 0723  PROT 7.3  ALBUMIN 4.1  AST 21  ALT 13  ALKPHOS 64  BILITOT 1.4*   Studies/Results: Ct Abdomen Pelvis W Contrast  Result Date: 01/31/2019 CLINICAL DATA:  Right lower quadrant pain, cramping, nausea, vomiting EXAM: CT ABDOMEN AND PELVIS WITH CONTRAST TECHNIQUE: Multidetector CT imaging of the abdomen and pelvis was performed using the standard protocol following bolus administration of intravenous contrast. CONTRAST:  185mL  OMNIPAQUE IOHEXOL 300 MG/ML  SOLN COMPARISON:  01/29/2019 FINDINGS: Lower chest: No acute abnormality. Hepatobiliary: No focal liver abnormality is seen. No gallstones, gallbladder wall thickening, or biliary dilatation. Pancreas: Unremarkable. No pancreatic ductal dilatation or surrounding inflammatory changes. Spleen: Normal in size without focal abnormality. Adrenals/Urinary Tract: Adrenal glands are unremarkable. Kidneys are normal, without renal calculi, focal lesion, or hydronephrosis. Bladder is unremarkable. Stomach/Bowel: Stomach is within normal limits. Redemonstrated inflammation and thickening of the terminal ileum and ileocecal valve (series 6, image 69) with fecalized contents of the distal small bowel and dilatation of the ileum up to 4.9 cm. There is gas and stool present in the colon to the rectum. There is redemonstrated inflamed appearance of the appendix measuring up to 1.0 cm with air in the appendiceal lumen. Vascular/Lymphatic: No significant vascular findings are present. No enlarged abdominal or pelvic lymph nodes. Reproductive: No mass or other abnormality. Other: No abdominal wall hernia or abnormality. Small volume ascites in the low pelvis, new compared to prior examination. Musculoskeletal: No acute or significant osseous findings. IMPRESSION: 1. Redemonstrated inflammation and thickening of the terminal ileum and ileocecal valve (series 6, image 69) with fecalized contents of the distal small bowel and dilatation of the ileum up to 4.9 cm. There is gas and stool present in the colon to the rectum. Findings are generally similar to recent CT dated 01/29/2019 and highly suspicious for inflammatory bowel disease, specifically  Crohn's disease, and complication by ileal stricture. 2. There is redemonstrated inflamed appearance of the appendix measuring up to 1.0 cm with air in the appendiceal lumen. Generally favor reactive inflammation from adjacent bowel inflammation given the presence of  air in the appendiceal lumen and no significant interval progression of findings. 3. Small volume ascites in the low pelvis, new compared to prior examination. Electronically Signed   By: Eddie Candle M.D.   On: 01/31/2019 09:39    Assessment / Plan:   Assessment: 1. Probable Crohn's Disease with ileal stricture via CT: Started on Solu-medrol 60mg  IV 01/31/19, patient improved overnight with 3 solid BM and no further abdominal pain, nausea or vomiting 2. Leukocytosis: resolved  Plan: 1. Patient can likely be transitioned to oral steroids tomorrow, will continue IV today 2. Will need follow up in our outpatient clinic with colonoscopy for further eval, discussed this with him today 3. Advanced to low fiber diet today given complete resolution of symptoms, did explain that if he has any further nausea, abdominal pain or vomiting he needs to let us know and stop eating 4. Discontinued Cipro 5. Please await any further recommendations from Dr. Havery Moros later today  Thank you for your kind consultation, we will continue to follow.   LOS: 1 day   Levin Erp  02/01/2019, 10:18 AM

## 2019-02-01 NOTE — Progress Notes (Signed)
Patient ID: James Ortiz, male   DOB: [birth year], 33 y.o.   MRN: [id number]  PROGRESS NOTE    James Ortiz  NFA:[id number] DOB: 1986-09-25 DOA: 01/31/2019 PCP: Patient, No Pcp Per   Brief Narrative:  33 year old male with history of Crohn's disease with remote history of treatment in the past presented with abdominal pain.  CT of the abdomen showed IBD with ileal stricture.  GI was consulted.  Patient was started on Solu-Medrol.   Assessment & Plan:   Principal Problem:   Inflammatory bowel disease Active Problems:   Hyperglycemia   Crohn's disease of small intestine with other complication ([place])   Probable Crohn's disease with ileal stricture seen on CT of the abdomen -GI evaluation appreciated.  Currently on Solu-Medrol.  Abdominal pain improving.  Diet advancement as per GI.  We will follow-up with GI regarding need for ciprofloxacin  Leukocytosis -Probably from above.  Resolved.  Monitor   DVT prophylaxis: Early ambulation Code Status: Full Family Communication: None at bedside Disposition Plan: Home in 1 to 2 days once cleared by GI  Consultants: GI  Procedures: None  Antimicrobials: Cipro from 01/31/2019 onwards   Subjective: Patient seen and examined at bedside.  He feels that her abdominal pain is getting better.  No overnight fever, nausea or vomiting.  He had 2 bowel movements since admission.  Objective: Vitals:   01/31/19 1131 01/31/19 1528 01/31/19 2146 02/01/19 0534  BP: (!) 102/58 107/62 111/61 (!) 117/55  Pulse: 68 64 (!) 51 (!) 51  Resp: 16 16 17    Temp: 98.3 F (36.8 C) 98.1 F (36.7 C) 98.4 F (36.9 C) 98.5 F (36.9 C)  TempSrc: Oral Oral Oral Oral  SpO2: 98% 97% 97% 98%    Intake/Output Summary (Last 24 hours) at 02/01/2019 1005 Last data filed at 02/01/2019 4696 Gross per 24 hour  Intake 1593.18 ml  Output -  Net 1593.18 ml   There were no vitals filed for this visit.  Examination:  General exam: Appears calm and  comfortable  Respiratory system: Bilateral decreased breath sounds at bases Cardiovascular system: S1 & S2 heard, Rate controlled Gastrointestinal system: Abdomen is nondistended, soft and nontender. Normal bowel sounds heard. Extremities: No cyanosis, edema    Data Reviewed: I have personally reviewed following labs and imaging studies  CBC: Recent Labs  Lab 01/29/19 1624 01/31/19 0723 02/01/19 0452  WBC 10.4 12.4* 9.5  HGB 13.6 15.1 13.3  HCT 44.7 46.5 42.2  MCV 82.3 80.9 80.2  PLT 259 302 295   Basic Metabolic Panel: Recent Labs  Lab 01/29/19 1624 01/31/19 0723 02/01/19 0452  NA 140 138 139  K 4.0 4.4 4.0  CL 105 100 106  CO2 28 25 25   GLUCOSE 107* 132* 108*  BUN 11 8 12   CREATININE 1.00 1.20 0.99  CALCIUM 8.9 9.4 9.2   GFR: CrCl cannot be calculated (Unknown ideal weight.). Liver Function Tests: Recent Labs  Lab 01/29/19 1624 01/31/19 0723  AST 17 21  ALT 13 13  ALKPHOS 55 64  BILITOT 0.5 1.4*  PROT 6.9 7.3  ALBUMIN 3.6 4.1   Recent Labs  Lab 01/29/19 1624 01/31/19 0723  LIPASE 33 46   No results for input(s): AMMONIA in the last 168 hours. Coagulation Profile: No results for input(s): INR, PROTIME in the last 168 hours. Cardiac Enzymes: No results for input(s): CKTOTAL, CKMB, CKMBINDEX, TROPONINI in the last 168 hours. BNP (last 3 results) No results for input(s): PROBNP in the  last 8760 hours. HbA1C: No results for input(s): HGBA1C in the last 72 hours. CBG: No results for input(s): GLUCAP in the last 168 hours. Lipid Profile: No results for input(s): CHOL, HDL, LDLCALC, TRIG, CHOLHDL, LDLDIRECT in the last 72 hours. Thyroid Function Tests: No results for input(s): TSH, T4TOTAL, FREET4, T3FREE, THYROIDAB in the last 72 hours. Anemia Panel: No results for input(s): VITAMINB12, FOLATE, FERRITIN, TIBC, IRON, RETICCTPCT in the last 72 hours. Sepsis Labs: Recent Labs  Lab 01/31/19 0839  LATICACIDVEN 1.4    No results found for this or  any previous visit (from the past 240 hour(s)).       Radiology Studies: Ct Abdomen Pelvis W Contrast  Result Date: 01/31/2019 CLINICAL DATA:  Right lower quadrant pain, cramping, nausea, vomiting EXAM: CT ABDOMEN AND PELVIS WITH CONTRAST TECHNIQUE: Multidetector CT imaging of the abdomen and pelvis was performed using the standard protocol following bolus administration of intravenous contrast. CONTRAST:  133m OMNIPAQUE IOHEXOL 300 MG/ML  SOLN COMPARISON:  01/29/2019 FINDINGS: Lower chest: No acute abnormality. Hepatobiliary: No focal liver abnormality is seen. No gallstones, gallbladder wall thickening, or biliary dilatation. Pancreas: Unremarkable. No pancreatic ductal dilatation or surrounding inflammatory changes. Spleen: Normal in size without focal abnormality. Adrenals/Urinary Tract: Adrenal glands are unremarkable. Kidneys are normal, without renal calculi, focal lesion, or hydronephrosis. Bladder is unremarkable. Stomach/Bowel: Stomach is within normal limits. Redemonstrated inflammation and thickening of the terminal ileum and ileocecal valve (series 6, image 69) with fecalized contents of the distal small bowel and dilatation of the ileum up to 4.9 cm. There is gas and stool present in the colon to the rectum. There is redemonstrated inflamed appearance of the appendix measuring up to 1.0 cm with air in the appendiceal lumen. Vascular/Lymphatic: No significant vascular findings are present. No enlarged abdominal or pelvic lymph nodes. Reproductive: No mass or other abnormality. Other: No abdominal wall hernia or abnormality. Small volume ascites in the low pelvis, new compared to prior examination. Musculoskeletal: No acute or significant osseous findings. IMPRESSION: 1. Redemonstrated inflammation and thickening of the terminal ileum and ileocecal valve (series 6, image 69) with fecalized contents of the distal small bowel and dilatation of the ileum up to 4.9 cm. There is gas and stool  present in the colon to the rectum. Findings are generally similar to recent CT dated 01/29/2019 and highly suspicious for inflammatory bowel disease, specifically Crohn's disease, and complication by ileal stricture. 2. There is redemonstrated inflamed appearance of the appendix measuring up to 1.0 cm with air in the appendiceal lumen. Generally favor reactive inflammation from adjacent bowel inflammation given the presence of air in the appendiceal lumen and no significant interval progression of findings. 3. Small volume ascites in the low pelvis, new compared to prior examination. Electronically Signed   By: AEddie CandleM.D.   On: 01/31/2019 09:39        Scheduled Meds: . methylPREDNISolone (SOLU-MEDROL) injection  60 mg Intravenous Q24H   Continuous Infusions: . ciprofloxacin 400 mg (02/01/19 0506)     LOS: 1 day        KAline August MD [facility] Pager [phone] If 7PM-7AM, please contact night-coverage [email or web address] Password [email or web address]2/14/2020, 10:05 AM

## 2019-02-02 LAB — HEPATITIS B SURFACE ANTIGEN: Hepatitis B Surface Ag: NEGATIVE

## 2019-02-02 LAB — HEPATITIS B CORE ANTIBODY, TOTAL: Hep B Core Total Ab: NEGATIVE

## 2019-02-02 MED ORDER — PREDNISONE 20 MG PO TABS: 40.0000 mg | ORAL_TABLET | Freq: Every day | ORAL | 0 refills | Status: AC

## 2019-02-02 MED ORDER — ONDANSETRON HCL 4 MG PO TABS: 4.0000 mg | ORAL_TABLET | Freq: Four times a day (QID) | ORAL | 0 refills | Status: DC | PRN

## 2019-02-02 MED ORDER — TRAMADOL HCL 50 MG PO TABS: 50.0000 mg | ORAL_TABLET | Freq: Four times a day (QID) | ORAL | 0 refills | Status: AC | PRN

## 2019-02-02 NOTE — Discharge Summary (Signed)
Physician Discharge Summary  James Ortiz YQM:[id number] DOB: 22-Jun-1986 DOA: 01/31/2019  PCP: James Ortiz, No Pcp Per  Admit date: 01/31/2019 Discharge date: 02/02/2019  Admitted From: Home Disposition:  Home  Recommendations for Outpatient Follow-up:  1. Follow up with PCP in 1 week 2. Follow-up with GI as an outpatient   Home Health: No Equipment/Devices: None  Discharge Condition: Stable CODE STATUS: Full Diet recommendation: Heart Healthy /low fiber, low residue diet  Brief/Interim Summary: 33 year old male with history of Crohn's disease with remote history of treatment in the past presented with abdominal pain.  CT of the abdomen showed IBD with ileal stricture.  GI was consulted.  James Ortiz was started on Solu-Medrol.  His symptoms improved, diet was advanced.  He is tolerating diet and abdominal pain has improved.  GI has cleared the James Ortiz for discharge on oral prednisone.  Discharge Diagnoses:  Principal Problem:   Inflammatory bowel disease Active Problems:   Hyperglycemia   Crohn's disease of small intestine with other complication ([place])  Probable Crohn's disease with ileal stricture seen on CT of the abdomen -GI evaluation appreciated.  Treated with Solu-Medrol.  Abdominal pain has improved.   Tolerating diet.  Initially started on ciprofloxacin which has already been discontinued.  GI has cleared the James Ortiz for discharge on prednisone 40 mg daily for 2 weeks then decrease by 5 mg weekly until finished.  Outpatient follow-up with GI.  Leukocytosis -Probably from above.  Resolved.   Discharge Instructions  Discharge Instructions    Call MD for:  persistant nausea and vomiting   Complete by:  As directed    Call MD for:  severe uncontrolled pain   Complete by:  As directed    Call MD for:  temperature >100.4   Complete by:  As directed    Diet - low sodium heart healthy   Complete by:  As directed    Increase activity slowly   Complete by:  As directed       Allergies as of 02/02/2019      Reactions   Milk-related Compounds       Medication List    STOP taking these medications   doxycycline 100 MG tablet Commonly known as:  VIBRA-TABS     TAKE these medications   carboxymethylcellulose 0.5 % Soln Commonly known as:  REFRESH PLUS Place 1 drop into the left eye 3 (three) times daily as needed.   finasteride 1 MG tablet Commonly known as:  PROPECIA Take 1 mg by mouth daily. Hair loss   multivitamin with minerals tablet Take 1 tablet by mouth daily.   ondansetron 4 MG tablet Commonly known as:  ZOFRAN Take 1 tablet (4 mg total) by mouth every 6 (six) hours as needed for nausea.   polyethylene glycol packet Commonly known as:  MIRALAX / GLYCOLAX Take 17 g by mouth daily.   predniSONE 20 MG tablet Commonly known as:  DELTASONE Take 2 tablets (40 mg total) by mouth daily with breakfast for 14 days.   sodium chloride 5 % ophthalmic solution Commonly known as:  MURO 128 Place 1 drop into the left eye 3 (three) times daily.   sodium chloride 5 % ophthalmic ointment Commonly known as:  MURO 528 Place 1 application into the left eye at bedtime.   traMADol 50 MG tablet Commonly known as:  ULTRAM Take 1 tablet (50 mg total) by mouth every 6 (six) hours as needed for moderate pain.      Follow-up Information  [facility]. Schedule an appointment as soon as possible for a visit.   Specialty:  Gastroenterology Why:  We will call with appointment on Monday 02/04/19 Contact information: [address] [place] [address] [phone]       PCP. Schedule an appointment as soon as possible for a visit in 1 week(s).          Allergies  Allergen Reactions  . Milk-Related Compounds     Consultations:  GI   Procedures/Studies: Ct Abdomen Pelvis W Contrast  Result Date: 01/31/2019 CLINICAL DATA:  Right lower quadrant pain, cramping, nausea, vomiting EXAM: CT ABDOMEN AND PELVIS  WITH CONTRAST TECHNIQUE: Multidetector CT imaging of the abdomen and pelvis was performed using the standard protocol following bolus administration of intravenous contrast. CONTRAST:  142m OMNIPAQUE IOHEXOL 300 MG/ML  SOLN COMPARISON:  01/29/2019 FINDINGS: Lower chest: No acute abnormality. Hepatobiliary: No focal liver abnormality is seen. No gallstones, gallbladder wall thickening, or biliary dilatation. Pancreas: Unremarkable. No pancreatic ductal dilatation or surrounding inflammatory changes. Spleen: Normal in size without focal abnormality. Adrenals/Urinary Tract: Adrenal glands are unremarkable. Kidneys are normal, without renal calculi, focal lesion, or hydronephrosis. Bladder is unremarkable. Stomach/Bowel: Stomach is within normal limits. Redemonstrated inflammation and thickening of the terminal ileum and ileocecal valve (series 6, image 69) with fecalized contents of the distal small bowel and dilatation of the ileum up to 4.9 cm. There is gas and stool present in the colon to the rectum. There is redemonstrated inflamed appearance of the appendix measuring up to 1.0 cm with air in the appendiceal lumen. Vascular/Lymphatic: No significant vascular findings are present. No enlarged abdominal or pelvic lymph nodes. Reproductive: No mass or other abnormality. Other: No abdominal wall hernia or abnormality. Small volume ascites in the low pelvis, new compared to prior examination. Musculoskeletal: No acute or significant osseous findings. IMPRESSION: 1. Redemonstrated inflammation and thickening of the terminal ileum and ileocecal valve (series 6, image 69) with fecalized contents of the distal small bowel and dilatation of the ileum up to 4.9 cm. There is gas and stool present in the colon to the rectum. Findings are generally similar to recent CT dated 01/29/2019 and highly suspicious for inflammatory bowel disease, specifically Crohn's disease, and complication by ileal stricture. 2. There is  redemonstrated inflamed appearance of the appendix measuring up to 1.0 cm with air in the appendiceal lumen. Generally favor reactive inflammation from adjacent bowel inflammation given the presence of air in the appendiceal lumen and no significant interval progression of findings. 3. Small volume ascites in the low pelvis, new compared to prior examination. Electronically Signed   By: AEddie CandleM.D.   On: 01/31/2019 09:39   Ct Abdomen Pelvis W Contrast  Result Date: 01/29/2019 CLINICAL DATA:  Severe generalized abdominal pain for 2 weeks with intermittent nausea and vomiting. EXAM: CT ABDOMEN AND PELVIS WITH CONTRAST TECHNIQUE: Multidetector CT imaging of the abdomen and pelvis was performed using the standard protocol following bolus administration of intravenous contrast. CONTRAST:  1058mOMNIPAQUE IOHEXOL 300 MG/ML  SOLN COMPARISON:  None. FINDINGS: Lower chest: Unremarkable. Hepatobiliary: No suspicious focal abnormality within the liver parenchyma. There is no evidence for gallstones, gallbladder wall thickening, or pericholecystic fluid. No intrahepatic or extrahepatic biliary dilation. Pancreas: No focal mass lesion. No dilatation of the main duct. No intraparenchymal cyst. No peripancreatic edema. Spleen: No splenomegaly. No focal mass lesion. Adrenals/Urinary Tract: No adrenal nodule or mass. Kidneys unremarkable. No evidence for hydroureter. The urinary bladder appears normal for the  degree of distention. Stomach/Bowel: Stomach is unremarkable. No gastric wall thickening. No evidence of outlet obstruction. Duodenum is normally positioned as is the ligament of Treitz. No small bowel wall thickening. No small bowel dilatation. Distal and terminal ileum is dilated up to 4.6 cm diameter. Fecalization of enteric contents noted in the distal and terminal ileum. There is mucosal hyperenhancement in the terminal ileum extending into the ileocecal valve. The appendix is dilated up to 12 mm and is also  hyperemic. Right colon is decompressed. No gross colonic mass. No colonic wall thickening. Vascular/Lymphatic: No abdominal aortic aneurysm. No abdominal aortic atherosclerotic calcification. There is no gastrohepatic or hepatoduodenal ligament lymphadenopathy. No intraperitoneal or retroperitoneal lymphadenopathy. Increased number of lymph nodes are seen in the central small bowel and ileocolic mesentery. No pelvic sidewall lymphadenopathy. Reproductive: The prostate gland and seminal vesicles are unremarkable. Other: No intraperitoneal free fluid. Musculoskeletal: No worrisome lytic or sclerotic osseous abnormality. IMPRESSION: 1. The distal 20-30 cm of ileum is distended up to 4.5 cm diameter. This distended distal ileum shows fecalization of enteric contents consistent with decreased motility/transit. The terminal ileum shows ab the normal enhancement in the mucosa that extends into the ileocecal valve with some apparent hyperenhancement in the adjacent cecum. Imaging features suggest infectious/inflammatory process in the terminal ileum/ileocecal valve with inflammatory stricture of the terminal ileum. 2. The appendix is dilated, thick walled, and hyperenhancing with surrounding periappendiceal edema/inflammation. As such, imaging features suggest acute appendicitis. However, the changes in the appendix do not appear to be the cause of the terminal ileal abnormality and while acute appendicitis can not be excluded by imaging, it is possible that the changes in the appendix are due to the same process involving the terminal ileum. 3. Increased number of upper normal lymph nodes identified in the ileocolic and central small bowel mesentery. 4. No intraperitoneal free fluid. No mesenteric or intraperitoneal abscess. Electronically Signed   By: Misty Stanley M.D.   On: 01/29/2019 20:15      Subjective: James Ortiz seen and examined at bedside.  He is tolerating diet.  No worsening abdominal pain or fever.  Wants  to go home.  Discharge Exam: Vitals:   02/01/19 2053 02/02/19 0450  BP: 111/69 101/64  Pulse: 76 (!) 52  Resp:    Temp: 98.2 F (36.8 C)   SpO2: 97% 97%   Vitals:   02/01/19 1331 02/01/19 2053 02/02/19 0450 02/02/19 1002  BP: 99/63 111/69 101/64   Pulse: (!) 55 76 (!) 52   Resp: 20     Temp: 98.4 F (36.9 C) 98.2 F (36.8 C)    TempSrc: Oral Oral    SpO2: 98% 97% 97%   Weight:    81.5 kg    General: Pt is alert, awake, not in acute distress Cardiovascular: rate controlled, S1/S2 + Respiratory: bilateral decreased breath sounds at bases Abdominal: Soft, NT, ND, bowel sounds + Extremities: no edema, no cyanosis    The results of significant diagnostics from this hospitalization (including imaging, microbiology, ancillary and laboratory) are listed below for reference.     Microbiology: No results found for this or any previous visit (from the past 240 hour(s)).   Labs: BNP (last 3 results) No results for input(s): BNP in the last 8760 hours. Basic Metabolic Panel: Recent Labs  Lab 01/29/19 1624 01/31/19 0723 02/01/19 0452  NA 140 138 139  K 4.0 4.4 4.0  CL 105 100 106  CO2 28 25 25   GLUCOSE 107* 132* 108*  BUN 11  8 12  CREATININE 1.00 1.20 0.99  CALCIUM 8.9 9.4 9.2   Liver Function Tests: Recent Labs  Lab 01/29/19 1624 01/31/19 0723  AST 17 21  ALT 13 13  ALKPHOS 55 64  BILITOT 0.5 1.4*  PROT 6.9 7.3  ALBUMIN 3.6 4.1   Recent Labs  Lab 01/29/19 1624 01/31/19 0723  LIPASE 33 46   No results for input(s): AMMONIA in the last 168 hours. CBC: Recent Labs  Lab 01/29/19 1624 01/31/19 0723 02/01/19 0452  WBC 10.4 12.4* 9.5  HGB 13.6 15.1 13.3  HCT 44.7 46.5 42.2  MCV 82.3 80.9 80.2  PLT 259 302 288   Cardiac Enzymes: No results for input(s): CKTOTAL, CKMB, CKMBINDEX, TROPONINI in the last 168 hours. BNP: Invalid input(s): POCBNP CBG: No results for input(s): GLUCAP in the last 168 hours. D-Dimer No results for input(s): DDIMER in  the last 72 hours. Hgb A1c No results for input(s): HGBA1C in the last 72 hours. Lipid Profile No results for input(s): CHOL, HDL, LDLCALC, TRIG, CHOLHDL, LDLDIRECT in the last 72 hours. Thyroid function studies No results for input(s): TSH, T4TOTAL, T3FREE, THYROIDAB in the last 72 hours.  Invalid input(s): FREET3 Anemia work up No results for input(s): VITAMINB12, FOLATE, FERRITIN, TIBC, IRON, RETICCTPCT in the last 72 hours. Urinalysis    Component Value Date/Time   COLORURINE YELLOW 01/31/2019 0839   APPEARANCEUR CLOUDY (A) 01/31/2019 0839   LABSPEC 1.014 01/31/2019 0839   PHURINE 7.0 01/31/2019 0839   GLUCOSEU NEGATIVE 01/31/2019 0839   HGBUR NEGATIVE 01/31/2019 0839   HGBUR moderate 07/19/2010 1413   BILIRUBINUR NEGATIVE 01/31/2019 0839   KETONESUR 5 (A) 01/31/2019 0839   PROTEINUR NEGATIVE 01/31/2019 0839   UROBILINOGEN 1.0 07/19/2010 1413   NITRITE NEGATIVE 01/31/2019 0839   LEUKOCYTESUR NEGATIVE 01/31/2019 0839   Sepsis Labs Invalid input(s): PROCALCITONIN,  WBC,  LACTICIDVEN Microbiology No results found for this or any previous visit (from the past 240 hour(s)).   Time coordinating discharge: 35 minutes  SIGNED:   Aline August, MD  [facility] 02/02/2019, 11:19 AM Pager: [phone]  If 7PM-7AM, please contact night-coverage [email or web address] Password [email or web address]

## 2019-02-02 NOTE — Progress Notes (Addendum)
    Progress Note   Subjective  Chief Complaint: Crohn's disease, abdominal pain, nausea and vomiting  This morning patient tells me he was tolerating a low fiber diet overnight and this morning, he continues with a mild right lower quadrant discomfort, but overall is much improved.  Denies any further nausea or vomiting.  No new complaints or concerns, okay with going home today.   Objective   Vital signs in last 24 hours: Temp:  [98.2 F (36.8 C)-98.4 F (36.9 C)] 98.2 F (36.8 C) (02/14 2053) Pulse Rate:  [52-76] 52 (02/15 0450) Resp:  [20] 20 (02/14 1331) BP: (99-111)/(63-69) 101/64 (02/15 0450) SpO2:  [97 %-98 %] 97 % (02/15 0450) Weight:  [81.5 kg] 81.5 kg (02/15 1002) Last BM Date: 02/01/19 General:    white male in NAD Heart:  Regular rate and rhythm; no murmurs Lungs: Respirations even and unlabored, lungs CTA bilaterally Abdomen:  Soft, mild RLQ ttp and nondistended. Normal bowel sounds. Extremities:  Without edema. Neurologic:  Alert and oriented,  grossly normal neurologically. Psych:  Cooperative. Normal mood and affect.  Intake/Output from previous day: 02/14 0701 - 02/15 0700 In: 240 [P.O.:240] Out: -   Lab Results: Recent Labs    01/31/19 0723 02/01/19 0452  WBC 12.4* 9.5  HGB 15.1 13.3  HCT 46.5 42.2  PLT 302 288   BMET Recent Labs    01/31/19 0723 02/01/19 0452  NA 138 139  K 4.4 4.0  CL 100 106  CO2 25 25  GLUCOSE 132* 108*  BUN 8 12  CREATININE 1.20 0.99  CALCIUM 9.4 9.2   LFT Recent Labs    01/31/19 0723  PROT 7.3  ALBUMIN 4.1  AST 21  ALT 13  ALKPHOS 64  BILITOT 1.4*    Assessment / Plan:   Assessment: 1.  Crohn's disease with ileal stricture via CT: Started on Solu-Medrol 60 mg IV 01/31/2019, received 2 doses and is improved dramatically over the past 36 hours, continuing with solid bowel movements today and only mild right lower quadrant abdominal pain, no further nausea or vomiting  Plan: 1.  Continue Prednisone 40 mg  daily for 2 weeks, then decrease by 5 mg weekly until finished 2.  Our office will call the patient on Monday to arrange a follow-up appointment with one of [place] in 1 to 2 weeks. 3.  Plans are for outpatient colonoscopy within the next month for further eval 4.  Patient is advised today to take it easy for at least a week before he resumes travel for work 5.  Recommend the patient continue on a low fiber /low residue diet at discharge 6. Patient would like labs done for celiac disease, we can do this outpatient 7. Patient ok for discharge today  Thank you for the kind consultation, we will sign off.   LOS: 2 days   Levin Erp  02/02/2019, 11:11 AM

## 2019-02-04 ENCOUNTER — Telehealth: Payer: Self-pay

## 2019-02-04 LAB — QUANTIFERON-TB GOLD PLUS (RQFGPL)
QuantiFERON Mitogen Value: >10 IU/mL
QuantiFERON Nil Value: 0.02 IU/mL
QuantiFERON TB1 Ag Value: 0.03 IU/mL
QuantiFERON TB2 Ag Value: 0.02 IU/mL

## 2019-02-04 LAB — QUANTIFERON-TB GOLD PLUS: QUANTIFERON-TB GOLD PLUS: NEGATIVE

## 2019-02-04 NOTE — Telephone Encounter (Signed)
-----   Message from Levin Erp, [place] sent at 02/02/2019 11:16 AM EST ----- Regarding: Appt Patient needs follow up with me or other APP in 1-2 weeks for Crohn's. Thanks-JLL

## 2019-02-04 NOTE — Telephone Encounter (Signed)
PT called advised that he needed to come in for a colonoscopy. Would you like to schedule an office visit before the Recall Colon.?

## 2019-02-04 NOTE — Telephone Encounter (Signed)
Please schedule appt with James Ortiz or other app.  In the next 2 weeks.

## 2019-02-06 LAB — THIOPURINE METHYLTRANSFERASE (TPMT), RBC: TPMT Activity:: 20.1 Units/mL RBC

## 2019-02-13 ENCOUNTER — Encounter: Payer: Self-pay | Admitting: Physician Assistant

## 2019-02-13 ENCOUNTER — Ambulatory Visit: Payer: BLUE CROSS/BLUE SHIELD | Admitting: Physician Assistant

## 2019-02-13 VITALS — BP 114/60 | HR 89 | Ht 74.0 in | Wt 186.1 lb

## 2019-02-13 DIAGNOSIS — R935 Abnormal findings on diagnostic imaging of other abdominal regions, including retroperitoneum: Secondary | ICD-10-CM

## 2019-02-13 DIAGNOSIS — R1031 Right lower quadrant pain: Secondary | ICD-10-CM | POA: Diagnosis not present

## 2019-02-13 MED ORDER — PREDNISONE 10 MG PO TABS: ORAL_TABLET | ORAL | 0 refills | Status: AC

## 2019-02-13 MED ORDER — SUPREP BOWEL PREP KIT 17.5-3.13-1.6 GM/177ML PO SOLN: ORAL | 0 refills | Status: DC

## 2019-02-13 NOTE — Patient Instructions (Addendum)
If you are age 34 or older, your body mass index should be between 23-30. Your Body mass index is 23.9 kg/m. If this is out of the aforementioned range listed, please consider follow up with your Primary Care Provider.  If you are age 24 or younger, your body mass index should be between 19-25. Your Body mass index is 23.9 kg/m. If this is out of the aformentioned range listed, please consider follow up with your Primary Care Provider.   You have been scheduled for a colonoscopy. Please follow written instructions given to you at your visit today.  Please pick up your prep supplies at the pharmacy within the next 1-3 days. If you use inhalers (even only as needed), please bring them with you on the day of your procedure. Your physician has requested that you go to www.startemmi.com and enter the access code given to you at your visit today. This web site gives a general overview about your procedure. However, you should still follow specific instructions given to you by our office regarding your preparation for the procedure.  We have sent the following medications to your pharmacy for you to pick up at your convenience: Prednisone 10mg :  Starting on March 1st, take as directed, starting with 35mg  (3.5 tablets) every day for 7 days, then decrease by 5mg  each week.  Thank you for entrusting me with your care and for choosing [facility], [place], [place]

## 2019-02-13 NOTE — Progress Notes (Signed)
Chief Complaint: Follow-up hospitalization for suspected IBD flare  HPI:    James Ortiz is a 33 year old Caucasian male with a past medical history as listed below, who presents to clinic today after recently being seen in the hospital by our service and assigned to Dr. Havery Moros for suspected IBD flare.    01/31/2019 consult from our service for suspected Crohn's flare with a CT suggestive of active Crohn's with dilation of the ileum.  At that time started on IV steroids.  He improved over the next 2 days and was discharged on 02/02/2019 on Prednisone 40 mg daily x2 weeks with a taper down by 5 mg/week thereafter and plans for colonoscopy in the near future.  Patient also had preemptive testing including TPMT enzyme assay, QuantiFERON gold and hepatitis testing.    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    Today, the patient tells me he is doing well, he only felt the right lower quadrant discomfort for maybe 2 days after leaving the hospital and since then has had no further symptoms.  He is still abiding by mostly a low fiber diet.  Tells me he is doing well with the Prednisone, still on 40 mg/day with intentions to tapering down starting this weekend.  Does explain that he has had slightly decreased sleep on the Prednisone, maybe 3 to 6 hours a night but he is "very productive", also describes being somewhat "snappy" as far as his emotions.  Also some palpitations yesterday but this was after taking his Prednisone and drinking a "nitro coffee".  Having normal solid bowel movements and is back to work.    Denies fever, chills, weight loss, nausea, vomiting, heartburn, reflux or symptoms that awaken him from sleep.  Past Medical History:  Diagnosis Date  . Chest pain   . Contusion of rib on left side   . Corneal abrasion    non-healing, wore a permanent contact and finally removed yesterday  . RUQ pain   . Sinusitis     Past  Surgical History:  Procedure Laterality Date  . KNEE SURGERY      Current Outpatient Medications  Medication Sig Dispense Refill  . carboxymethylcellulose (REFRESH PLUS) 0.5 % SOLN Place 1 drop into the left eye 3 (three) times daily as needed.    . finasteride (PROPECIA) 1 MG tablet Take 1 mg by mouth daily. Hair loss    . Multiple Vitamins-Minerals (MULTIVITAMIN WITH MINERALS) tablet Take 1 tablet by mouth daily.    . predniSONE (DELTASONE) 20 MG tablet Take 2 tablets (40 mg total) by mouth daily with breakfast for 14 days. 28 tablet 0  . sodium chloride (MURO 128) 5 % ophthalmic ointment Place 1 application into the left eye at bedtime.    . sodium chloride (MURO 128) 5 % ophthalmic solution Place 1 drop into the left eye 3 (three) times daily.    . ondansetron (ZOFRAN) 4 MG tablet Take 1 tablet (4 mg total) by mouth every 6 (six) hours as needed for nausea. (Patient not taking: Reported on 02/13/2019) 20 tablet 0  . polyethylene glycol (MIRALAX / GLYCOLAX) packet Take 17 g by mouth daily.    . traMADol (ULTRAM) 50 MG tablet Take 1 tablet (50 mg total) by mouth every 6 (six) hours as needed for moderate pain. (Patient not taking: Reported on 02/13/2019) 20 tablet 0   No current facility-administered medications for this visit.     Allergies as of 02/13/2019 -  Review Complete 02/13/2019  Allergen Reaction Noted  . Milk-related compounds      Family History  Problem Relation Age of Onset  . Crohn's disease Father   . Other Other        HEALTHY  . Other Other        HEALTHY  . Other Other        HEALTHY    Social History   Socioeconomic History  . Marital status: Single    Spouse name: Not on file  . Number of children: Not on file  . Years of education: Not on file  . Highest education level: Not on file  Occupational History  . Occupation: Optometrist  Social Needs  . Financial resource strain: Not on file  . Food insecurity:    Worry: Not on file    Inability: Not on  file  . Transportation needs:    Medical: Not on file    Non-medical: Not on file  Tobacco Use  . Smoking status: Former Smoker    Packs/day: 1.00    Years: 12.00    Pack years: 12.00  . Smokeless tobacco: Never Used  Substance and Sexual Activity  . Alcohol use: Yes    Comment: h/o heavy use  . Drug use: No  . Sexual activity: Not on file  Lifestyle  . Physical activity:    Days per week: Not on file    Minutes per session: Not on file  . Stress: Not on file  Relationships  . Social connections:    Talks on phone: Not on file    Gets together: Not on file    Attends religious service: Not on file    Active member of club or organization: Not on file    Attends meetings of clubs or organizations: Not on file    Relationship status: Not on file  . Intimate partner violence:    Fear of current or ex partner: Not on file    Emotionally abused: Not on file    Physically abused: Not on file    Forced sexual activity: Not on file  Other Topics Concern  . Not on file  Social History Narrative  . Not on file    Review of Systems:    Constitutional: No weight loss, fever or chills Cardiovascular: No chest pain Respiratory: No SOB  Gastrointestinal: See HPI and otherwise negative   Physical Exam:  Vital signs: BP 114/60   Pulse 89   Ht 6\' 2"  (1.88 m)   Wt 186 lb 2 oz (84.4 kg)   BMI 23.90 kg/m    Constitutional:   Pleasant Caucasian male appears to be in NAD, Well developed, Well nourished, alert and cooperative Respiratory: Respirations even and unlabored. Lungs clear to auscultation bilaterally.   No wheezes, crackles, or rhonchi.  Cardiovascular: Normal S1, S2. No MRG. Regular rate and rhythm. No peripheral edema, cyanosis or pallor.  Gastrointestinal:  Soft, nondistended, nontender. No rebound or guarding. Normal bowel sounds. No appreciable masses or hepatomegaly. Rectal:  Not performed.  Psychiatric: Demonstrates good judgement and reason without abnormal affect  or behaviors.  RELEVANT LABS AND IMAGING: CBC    Component Value Date/Time   WBC 9.5 02/01/2019 0452   RBC 5.26 02/01/2019 0452   HGB 13.3 02/01/2019 0452   HCT 42.2 02/01/2019 0452   PLT 288 02/01/2019 0452   MCV 80.2 02/01/2019 0452   MCH 25.3 (L) 02/01/2019 0452   MCHC 31.5 02/01/2019 0452   RDW 13.4  02/01/2019 0452    CMP     Component Value Date/Time   NA 139 02/01/2019 0452   K 4.0 02/01/2019 0452   CL 106 02/01/2019 0452   CO2 25 02/01/2019 0452   GLUCOSE 108 (H) 02/01/2019 0452   BUN 12 02/01/2019 0452   CREATININE 0.99 02/01/2019 0452   CALCIUM 9.2 02/01/2019 0452   PROT 7.3 01/31/2019 0723   ALBUMIN 4.1 01/31/2019 0723   AST 21 01/31/2019 0723   ALT 13 01/31/2019 0723   ALKPHOS 64 01/31/2019 0723   BILITOT 1.4 (H) 01/31/2019 0723   GFRNONAA >60 02/01/2019 0452   GFRAA >60 02/01/2019 0452     EXAM: CT ABDOMEN AND PELVIS WITH CONTRAST   TECHNIQUE: Multidetector CT imaging of the abdomen and pelvis was performed using the standard protocol following bolus administration of intravenous contrast.   CONTRAST:  127mL OMNIPAQUE IOHEXOL 300 MG/ML  SOLN   COMPARISON:  01/29/2019   FINDINGS: Lower chest: No acute abnormality.   Hepatobiliary: No focal liver abnormality is seen. No gallstones, gallbladder wall thickening, or biliary dilatation.   Pancreas: Unremarkable. No pancreatic ductal dilatation or surrounding inflammatory changes.   Spleen: Normal in size without focal abnormality.   Adrenals/Urinary Tract: Adrenal glands are unremarkable. Kidneys are normal, without renal calculi, focal lesion, or hydronephrosis. Bladder is unremarkable.   Stomach/Bowel: Stomach is within normal limits. Redemonstrated inflammation and thickening of the terminal ileum and ileocecal valve (series 6, image 69) with fecalized contents of the distal small bowel and dilatation of the ileum up to 4.9 cm. There is gas and stool present in the colon to the rectum.  There is redemonstrated inflamed appearance of the appendix measuring up to 1.0 cm with air in the appendiceal lumen.   Vascular/Lymphatic: No significant vascular findings are present. No enlarged abdominal or pelvic lymph nodes.   Reproductive: No mass or other abnormality.   Other: No abdominal wall hernia or abnormality. Small volume ascites in the low pelvis, new compared to prior examination.   Musculoskeletal: No acute or significant osseous findings.   IMPRESSION: 1. Redemonstrated inflammation and thickening of the terminal ileum and ileocecal valve (series 6, image 69) with fecalized contents of the distal small bowel and dilatation of the ileum up to 4.9 cm. There is gas and stool present in the colon to the rectum. Findings are generally similar to recent CT dated 01/29/2019 and highly suspicious for inflammatory bowel disease, specifically Crohn's disease, and complication by ileal stricture.   2. There is redemonstrated inflamed appearance of the appendix measuring up to 1.0 cm with air in the appendiceal lumen. Generally favor reactive inflammation from adjacent bowel inflammation given the presence of air in the appendiceal lumen and no significant interval progression of findings.   3. Small volume ascites in the low pelvis, new compared to prior examination.     Electronically Signed   By: Eddie Candle M.D.   On: 01/31/2019 09:39    Assessment: 1.  Abnormal CT abdomen pelvis: History of Crohn's as a child, had not been on medication for years, CT as above with high suspicion for Crohn's flare complicated by ileal stricture, patient now improved on Prednisone 40 mg daily over the past 2 weeks, no further abdominal pain or nausea, bowel movements normal 2.  Right lower quadrant abdominal pain: With above  Plan: 1.  Scheduled patient for colonoscopy with Dr. Havery Moros.  He requests a Friday if possible.  Did discuss risks, benefits, limitations and  alternatives and  patient agrees to proceed. 2.  Continue Prednisone 40 mg daily until 2/30/20, then taper down to 35 mg daily for a week and then down by 5 mg every week after that.  New prescription was sent to the pharmacy today. 3.  Reviewed with patient that he should still abide by a mostly low fiber diet for now. 4.  Pending results of colonoscopy, Dr. Havery Moros can discuss further therapies versus surgery versus other.  Again patient is hopeful that he will not have to be on maintenance meds for the rest of his life. 5.  Patient to follow in clinic per recommendations from Dr. Havery Moros after time of procedure.  James Newer, PA-C [facility] 02/13/2019, 8:34 AM  Cc: No ref. provider found

## 2019-02-13 NOTE — Progress Notes (Signed)
Agree with assessment and plan as outlined. Ileal Crohn's with suspected stricture. Agree with colonoscopy to stage his disease and evaluate prior to initiation of either biologic therapy or consideration for surgery. If he has a tight stricture with short segment of disease involvement, could consider surgical resection up front with close monitoring if he wants to avoid maintenance therapy. Will discuss options with him after colonoscopy.

## 2019-03-01 ENCOUNTER — Other Ambulatory Visit: Payer: Self-pay

## 2019-03-01 ENCOUNTER — Ambulatory Visit (AMBULATORY_SURGERY_CENTER): Payer: BLUE CROSS/BLUE SHIELD | Admitting: Gastroenterology

## 2019-03-01 ENCOUNTER — Encounter: Payer: Self-pay | Admitting: Gastroenterology

## 2019-03-01 VITALS — BP 101/60 | HR 66 | Temp 98.4°F | Resp 12 | Ht 74.0 in | Wt 186.0 lb

## 2019-03-01 DIAGNOSIS — K635 Polyp of colon: Secondary | ICD-10-CM | POA: Diagnosis not present

## 2019-03-01 DIAGNOSIS — K50019 Crohn's disease of small intestine with unspecified complications: Secondary | ICD-10-CM | POA: Diagnosis present

## 2019-03-01 DIAGNOSIS — K50018 Crohn's disease of small intestine with other complication: Secondary | ICD-10-CM

## 2019-03-01 DIAGNOSIS — K5140 Inflammatory polyps of colon without complications: Secondary | ICD-10-CM

## 2019-03-01 MED ORDER — SODIUM CHLORIDE 0.9 % IV SOLN: 500.0000 mL | Freq: Once | INTRAVENOUS | Status: DC

## 2019-03-01 NOTE — Progress Notes (Signed)
To PACU, VSS. Report to [email or web address] 

## 2019-03-01 NOTE — Progress Notes (Signed)
Called to room to assist during endoscopic procedure.  Patient ID and intended procedure confirmed with present staff. Received instructions for my participation in the procedure from the performing physician.  

## 2019-03-01 NOTE — Op Note (Signed)
[facility] Patient Name: James Ortiz Procedure Date: 03/01/2019 3:25 PM MRN: [id number] Endoscopist: James Lipps P. Havery Moros , MD Age: 33 Referring MD:  Date of Birth: May 30, 1986 Gender: Male Account #: [id number] Procedure:                Colonoscopy Indications:              Follow-up of Crohn's disease of the small bowel.                            Longstanding ileal disease diagnosed several years                            ago, not on any therapy, recently admitted with                            bowel obstruction at the TI / IC valve stenosis. On                            prednisone with improvement of symptoms.                            Colonoscopy to restage disease Medicines:                Monitored Anesthesia Care Procedure:                Pre-Anesthesia Assessment:                           - Prior to the procedure, a History and Physical                            was performed, and patient medications and                            allergies were reviewed. The patient's tolerance of                            previous anesthesia was also reviewed. The risks                            and benefits of the procedure and the sedation                            options and risks were discussed with the patient.                            All questions were answered, and informed consent                            was obtained. Prior Anticoagulants: The patient has                            taken no previous anticoagulant or antiplatelet  agents. ASA Grade Assessment: II - A patient with                            mild systemic disease. After reviewing the risks                            and benefits, the patient was deemed in                            satisfactory condition to undergo the procedure.                           After obtaining informed consent, the colonoscope                            was passed under direct vision.  Throughout the                            procedure, the patient's blood pressure, pulse, and                            oxygen saturations were monitored continuously. The                            Colonoscope was introduced through the anus and                            advanced to the the cecum, identified by                            appendiceal orifice and ileocecal valve. The                            colonoscopy was performed without difficulty. The                            patient tolerated the procedure well. The quality                            of the bowel preparation was good. The ileocecal                            valve, appendiceal orifice, and rectum were                            photographed. Scope In: 3:27:48 PM Scope Out: 3:49:00 PM Scope Withdrawal Time: 0 hours 17 minutes 46 seconds  Total Procedure Duration: 0 hours 21 minutes 12 seconds  Findings:                 The perianal and digital rectal examinations were                            normal.  The IC valve was deformed and scarred down from                            Crohn's disease. A ? fistulous tract was noted just                            inferior to the valve. A benign-appearing,                            intrinsic severe stenosis was found at the                            ileocecal valve. The lumen was only a few mm wide                            and could not be traversed. No obvious inflammatory                            change was noted. Biopsies were taken with a cold                            forceps for histology.                           Polypoid lesions were found in the cecum and at the                            appendiceal orifice, most consistent with benign                            pseudoinflammatory polyps. Biopsies were taken with                            a cold forceps for histology.                           Scattered medium-mouthed  diverticula were found in                            the entire colon.                           Internal hemorrhoids were found during                            retroflexion. The hemorrhoids were small.                           The exam was otherwise without abnormality. No                            colonic inflammation Complications:            No immediate complications. Estimated blood loss:  Minimal. Estimated Blood Loss:     Estimated blood loss was minimal. 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 Biopsied.                           - Likely benign pseudoinflammatory polyps in the                            cecum and at the appendiceal orifice. Biopsied.                           - Deformed IC valve with scarring and ? fistulous                            tract.                           - Diverticulosis in the entire examined colon.                           - Internal hemorrhoids.                           - The examination was otherwise normal. Recommendation:           - Patient has a contact number available for                            emergencies. The signs and symptoms of potential                            delayed complications were discussed with the                            patient. Return to normal activities tomorrow.                            Written discharge instructions were provided to the                            patient.                           - Resume low residual diet.                           - Continue present medications including prednisone                            taper.                           - Await pathology results.                           -  I have discussed options with the patient - given                            severity if IC valve stenosis with fibrosis, if                            ileal segment of involved disease is  short, his                            preference is to be treated with surgical resection                            with close monitoring rather than biologic therapy                            up front which is reasonable. Will discuss surgical                            referral with him. James Lipps P. Sila Sarsfield, MD 03/01/2019 3:57:50 PM This report has been signed electronically.

## 2019-03-01 NOTE — Progress Notes (Signed)
Pt verbalize they told him he could eat two eggs this morning.Told Dr. Havery Moros pt ate two eggs this morning and pt states stools are clear and watery.

## 2019-03-01 NOTE — Patient Instructions (Signed)
Handouts given for diverticulosis and hemorrhoids.  Continue present medicines including prednisone taper.  Resume low residue diet.  Await pathology results.  YOU HAD AN ENDOSCOPIC PROCEDURE TODAY AT [facility]:   Refer to the procedure report that was given to you for any specific questions about what was found during the examination.  If the procedure report does not answer your questions, please call your gastroenterologist to clarify.  If you requested that your care partner not be given the details of your procedure findings, then the procedure report has been included in a sealed envelope for you to review at your convenience later.  YOU SHOULD EXPECT: Some feelings of bloating in the abdomen. Passage of more gas than usual.  Walking can help get rid of the air that was put into your GI tract during the procedure and reduce the bloating. If you had a lower endoscopy (such as a colonoscopy or flexible sigmoidoscopy) you may notice spotting of blood in your stool or on the toilet paper. If you underwent a bowel prep for your procedure, you may not have a normal bowel movement for a few days.  Please Note:  You might notice some irritation and congestion in your nose or some drainage.  This is from the oxygen used during your procedure.  There is no need for concern and it should clear up in a day or so.  SYMPTOMS TO REPORT IMMEDIATELY:   Following lower endoscopy (colonoscopy or flexible sigmoidoscopy):  Excessive amounts of blood in the stool  Significant tenderness or worsening of abdominal pains  Swelling of the abdomen that is new, acute  Fever of 100F or higher   For urgent or emergent issues, a gastroenterologist can be reached at any hour by calling [phone].   DIET:  We do recommend a small meal at first, but then you may proceed to your regular diet.  Drink plenty of fluids but you should avoid alcoholic beverages for 24 hours.  ACTIVITY:  You  should plan to take it easy for the rest of today and you should NOT DRIVE or use heavy machinery until tomorrow (because of the sedation medicines used during the test).    FOLLOW UP: Our staff will call the number listed on your records the next business day following your procedure to check on you and address any questions or concerns that you may have regarding the information given to you following your procedure. If we do not reach you, we will leave a message.  However, if you are feeling well and you are not experiencing any problems, there is no need to return our call.  We will assume that you have returned to your regular daily activities without incident.  If any biopsies were taken you will be contacted by phone or by letter within the next 1-3 weeks.  Please call us at [phone] if you have not heard about the biopsies in 3 weeks.    SIGNATURES/CONFIDENTIALITY: You and/or your care partner have signed paperwork which will be entered into your electronic medical record.  These signatures attest to the fact that that the information above on your After Visit Summary has been reviewed and is understood.  Full responsibility of the confidentiality of this discharge information lies with you and/or your care-partner.

## 2019-03-04 ENCOUNTER — Telehealth: Payer: Self-pay

## 2019-03-04 NOTE — Telephone Encounter (Signed)
Sent referral to [facility] for Crohns - Dr. Dema Severin or Gross.

## 2019-03-04 NOTE — Telephone Encounter (Signed)
-----   Message from Yetta Flock, MD sent at 03/01/2019  4:50 PM EDT ----- Regarding: referral for surgery Hi Jan, I was wondering if you could help place a referral for this patient to see [facility] colorectal surgery for Crohn's - Dr. Johney Maine or Dema Severin. Can you let me know who he is booked with and I want to discuss his case with them before his appointment. Thanks much

## 2019-03-04 NOTE — Telephone Encounter (Signed)
  Follow up Call-  Call back number 03/01/2019  Post procedure Call Back phone  # [phone]  Permission to leave phone message Yes  Some recent data might be hidden     Patient questions:  Do you have a fever, pain , or abdominal swelling? No. Pain Score  0 *  Have you tolerated food without any problems? Yes.    Have you been able to return to your normal activities? Yes.    Do you have any questions about your discharge instructions: Diet   No. Medications  No. Follow up visit  No.  Do you have questions or concerns about your Care? No.  Actions: * If pain score is 4 or above: No action needed, pain <4.

## 2019-03-05 NOTE — Telephone Encounter (Signed)
Jan can you let me know which provider he is booked with so I can contact them, thanks much

## 2019-03-11 ENCOUNTER — Encounter: Payer: Self-pay | Admitting: Gastroenterology

## 2019-03-11 NOTE — Telephone Encounter (Signed)
Called [facility] to check on appt. He is scheuled for 04-09-2019 at 11:00am with Dr. Johney Maine.

## 2019-03-11 NOTE — Telephone Encounter (Signed)
Thanks Jan, I have sent a note to Dr. Johney Maine about his case

## 2019-03-13 ENCOUNTER — Telehealth: Payer: Self-pay | Admitting: Gastroenterology

## 2019-03-13 NOTE — Telephone Encounter (Signed)
Called the patient to discuss options moving forward, I have spoken with the surgeons about his case. No answer, left message, will call back

## 2019-03-18 NOTE — Telephone Encounter (Signed)
Called again, no answer, will call again

## 2019-03-21 NOTE — Telephone Encounter (Signed)
Called patient. I had discussed his case with Dr. Johney Maine. [facility] does not feel they want to perform primary surgical therapy, the would reserve therapy for those who fail biologic therapy. Given this patient has a short segment of fibrosed bowel, I think he remains a good surgical candidate if he wants to avoid biologic. He strongly wishes to avoid biologic therapy and wants to be treated surrgically. In this light will cancel his visit with [facility]. I will refer him to Dr. Bishop Limbo at [facility] and discuss this case with her.   Sherlynn Stalls can you help with the following: - please cancel his appointment with [facility], Dr. Johney Maine - please refer to Dr. Renelda Mom, [facility], colorectal surgery to discuss resection for Crohn's disease. Thanks. Can you let me know date / time of appointment,

## 2019-03-21 NOTE — Telephone Encounter (Signed)
Good afternoon, Cancelled pt's surgery with [facility]. Scheduled surgery with Dr. Drue Flirt on 4/14 @ 10:00am

## 2019-03-25 NOTE — Telephone Encounter (Signed)
Okay thanks much

## 2019-10-09 ENCOUNTER — Other Ambulatory Visit: Payer: Self-pay

## 2019-10-09 DIAGNOSIS — Z20822 Contact with and (suspected) exposure to covid-19: Secondary | ICD-10-CM

## 2019-10-10 LAB — NOVEL CORONAVIRUS, NAA: SARS-CoV-2, NAA: NOT DETECTED

## 2020-06-02 DIAGNOSIS — Z9049 Acquired absence of other specified parts of digestive tract: Secondary | ICD-10-CM | POA: Diagnosis not present

## 2020-06-02 DIAGNOSIS — R911 Solitary pulmonary nodule: Secondary | ICD-10-CM | POA: Diagnosis not present

## 2020-06-02 DIAGNOSIS — K50011 Crohn's disease of small intestine with rectal bleeding: Secondary | ICD-10-CM | POA: Diagnosis not present

## 2020-06-02 DIAGNOSIS — K50012 Crohn's disease of small intestine with intestinal obstruction: Secondary | ICD-10-CM | POA: Diagnosis not present

## 2020-06-04 DIAGNOSIS — H18832 Recurrent erosion of cornea, left eye: Secondary | ICD-10-CM | POA: Diagnosis not present

## 2020-06-16 ENCOUNTER — Encounter: Payer: Self-pay | Admitting: Gastroenterology

## 2020-06-16 ENCOUNTER — Other Ambulatory Visit (INDEPENDENT_AMBULATORY_CARE_PROVIDER_SITE_OTHER): Payer: BC Managed Care – PPO

## 2020-06-16 ENCOUNTER — Ambulatory Visit: Payer: BC Managed Care – PPO | Admitting: Gastroenterology

## 2020-06-16 VITALS — BP 92/66 | HR 72 | Ht 74.0 in | Wt 191.0 lb

## 2020-06-16 DIAGNOSIS — R1031 Right lower quadrant pain: Secondary | ICD-10-CM | POA: Diagnosis not present

## 2020-06-16 DIAGNOSIS — K50012 Crohn's disease of small intestine with intestinal obstruction: Secondary | ICD-10-CM | POA: Diagnosis not present

## 2020-06-16 DIAGNOSIS — R1032 Left lower quadrant pain: Secondary | ICD-10-CM

## 2020-06-16 LAB — CBC WITH DIFFERENTIAL/PLATELET
Basophils Absolute: 0 10*3/uL (ref 0.0–0.1)
Basophils Relative: 0.6 % (ref 0.0–3.0)
Eosinophils Absolute: 0.1 10*3/uL (ref 0.0–0.7)
Eosinophils Relative: 1.6 % (ref 0.0–5.0)
HCT: 40.6 % (ref 39.0–52.0)
Hemoglobin: 13.2 g/dL (ref 13.0–17.0)
Lymphocytes Relative: 29.9 % (ref 12.0–46.0)
Lymphs Abs: 2.5 10*3/uL (ref 0.7–4.0)
MCHC: 32.4 g/dL (ref 30.0–36.0)
MCV: 79.2 fl (ref 78.0–100.0)
Monocytes Absolute: 0.6 10*3/uL (ref 0.1–1.0)
Monocytes Relative: 7.3 % (ref 3.0–12.0)
Neutro Abs: 5 10*3/uL (ref 1.4–7.7)
Neutrophils Relative %: 60.6 % (ref 43.0–77.0)
Platelets: 207 10*3/uL (ref 150.0–400.0)
RBC: 5.13 Mil/uL (ref 4.22–5.81)
RDW: 14.2 % (ref 11.5–15.5)
WBC: 8.2 10*3/uL (ref 4.0–10.5)

## 2020-06-16 MED ORDER — SUTAB 1479-225-188 MG PO TABS: 1.0000 | ORAL_TABLET | Freq: Once | ORAL | 0 refills | Status: AC

## 2020-06-16 NOTE — Patient Instructions (Signed)
If you are age 35 or older, your body mass index should be between 23-30. Your Body mass index is 24.52 kg/m. If this is out of the aforementioned range listed, please consider follow up with your Primary Care Provider.  If you are age 14 or younger, your body mass index should be between 19-25. Your Body mass index is 24.52 kg/m. If this is out of the aformentioned range listed, please consider follow up with your Primary Care Provider.   You have been scheduled for a colonoscopy. Please follow written instructions given to you at your visit today.  Please pick up your prep supplies at the pharmacy within the next 1-3 days. If you use inhalers (even only as needed), please bring them with you on the day of your procedure. Your physician has requested that you go to www.startemmi.com and enter the access code given to you at your visit today. This web site gives a general overview about your procedure. However, you should still follow specific instructions given to you by our office regarding your preparation for the procedure.  Please go to the lab in the basement of our building to have lab work done as you leave today. Hit "B" for basement when you get on the elevator.  When the doors open the lab is on your left.  We will call you with the results. Thank you.  Thank you for entrusting me with your care and for choosing [facility], Dr. [place] Cellar    Due to recent changes in healthcare laws, you may see the results of your imaging and laboratory studies on MyChart before your provider has had a chance to review them.  We understand that in some cases there may be results that are confusing or concerning to you. Not all laboratory results come back in the same time frame and the provider may be waiting for multiple results in order to interpret others.  Please give [place] 48 hours in order for your provider to thoroughly review all the results before contacting the office for  clarification of your results.

## 2020-06-16 NOTE — Progress Notes (Signed)
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 Admitted last year with an obstruction at the ileum / IC valve. Follow up colonoscopy with me March 2020 showed a distorted IC valve / stenosis at the ileum which could not be traversed. He was referred to see Renelda Mom at [facility] who performed ileocecectomy in April 2020. He states the surgery went quite well and his obstructive symptoms resolved. He has been doing pretty well since that time, although I have not seen him in well over a year. Path from his operative report showed chronic active inflammation but no pre-cancerous changes.   Historically he has wanted to avoid biologic or medical therapy for his Crohn's disease. Generally has been doing pretty well. He has some mild lower abdominal cramping at times, often associated with some urgency with his stools. He has some food sensitivities, in particular beer and certain alcohol which bothers him. He has occasional small amount of bright red blood in the stool but not concerning amount.  His stool varies from soft to solid to loose at times.  His weights been stable.  He is not really taking anything for his bowels.  He again is hoping to avoid medical therapy for his Crohn's disease if possible.  He has not yet had a surveillance colonoscopy post surgery.   Colonoscopy 03/01/19 - The perianal and digital rectal examinations were normal. - The IC valve was deformed and scarred down from Crohn's disease. A ? fistulous tract was noted just inferior to the valve. A benign-appearing, intrinsic severe stenosis was found at the ileocecal valve. The lumen was only a few mm wide and could not be traversed. No obvious inflammatory change was noted. Biopsies were taken with a cold forceps for histology. - Polypoid lesions were found in the cecum and at the appendiceal orifice, most consistent with  benign pseudoinflammatory polyps. Biopsies were taken with a cold forceps for histology. - Scattered medium-mouthed diverticula were found in the entire colon. - Internal hemorrhoids were found during retroflexion. The hemorrhoids were small. - The exam was otherwise without abnormality. No colonic inflammation   Surgery 04/12/19 - ileocecectomy - Dr. Renelda Mom Path shows chronic active inflammation, with polypoid lesion in the cecum removed - no evidence of atypical lymphoid proliferation  CT scan 06/26/19 - s/p ileocecectomy, multiple benign appearing lymph nodes in RLQ, benign small pulmonary nodule    Past Medical History:  Diagnosis Date  . Chest pain   . Contusion of rib on left side   . Corneal abrasion    non-healing, wore a permanent contact and finally removed yesterday  . Crohn's disease ([place])    ileal disease s/p ileocecectomy   . RUQ pain   . Sinusitis      Past Surgical History:  Procedure Laterality Date  . EYE SURGERY    . KNEE SURGERY     Family History  Problem Relation Age of Onset  . Crohn's disease Father   . Other Other        HEALTHY  . Other Other        HEALTHY  . Other Other        HEALTHY  . Colon cancer Neg Hx   . Esophageal cancer Neg Hx    Social History   Tobacco Use  . Smoking status: Former Smoker    Packs/day: 1.00  Years: 12.00    Pack years: 12.00  . Smokeless tobacco: Never Used  Vaping Use  . Vaping Use: Some days  . Devices: trying to quit   Substance Use Topics  . Alcohol use: Yes    Comment: h/o heavy use  . Drug use: No   Current Outpatient Medications  Medication Sig Dispense Refill  . carboxymethylcellulose (REFRESH PLUS) 0.5 % SOLN Place 1 drop into the left eye 3 (three) times daily as needed.    Marland Kitchen DOXYCYCLINE PO Take 100 mg elemental calcium/kg/hr by mouth daily.     . finasteride (PROPECIA) 1 MG tablet Take 1 mg by mouth daily. Hair loss    . Multiple Vitamins-Minerals (MULTIVITAMIN WITH MINERALS)  tablet Take 1 tablet by mouth daily.    . ondansetron (ZOFRAN) 4 MG tablet Take 1 tablet (4 mg total) by mouth every 6 (six) hours as needed for nausea. 20 tablet 0  . polyethylene glycol (MIRALAX / GLYCOLAX) packet Take 17 g by mouth daily.    . sodium chloride (MURO 128) 5 % ophthalmic ointment Place 1 application into the left eye at bedtime.    . sodium chloride (MURO 128) 5 % ophthalmic solution Place 1 drop into the left eye 3 (three) times daily.    . Sodium Sulfate-Mag Sulfate-KCl (SUTAB) [phone] MG TABS Take 1 kit by mouth once for 1 dose. BIN: [id number] PCN: CN GROUP: [id number] MEMBER ID: [id number];LFY AS CASH 24 tablet 0   No current facility-administered medications for this visit.   Allergies  Allergen Reactions  . Milk-Related Compounds      Review of Systems: All systems reviewed and negative except where noted in HPI.    No results found.  Physical Exam: BP 92/66   Pulse 72   Ht _0  (1.88 m)   Wt 191 lb (86.6 kg)   BMI 24.52 kg/m  Constitutional: Pleasant,well-developed, male in no acute distress. HEENT: Normocephalic and atraumatic. Conjunctivae are normal. No scleral icterus. Neck supple.  Cardiovascular: Normal rate, regular rhythm.  Pulmonary/chest: Effort normal and breath sounds normal. No wheezing, rales or rhonchi. Abdominal: Soft, nondistended, nontender. There are no masses palpable. No hepatomegaly. Extremities: no edema Lymphadenopathy: No cervical adenopathy noted. Neurological: Alert and oriented to person place and time. Skin: Skin is warm and dry. No rashes noted. Psychiatric: Normal mood and affect. Behavior is normal.   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   He has not been on medical therapy for several years for this.  Generally he has been doing pretty well post surgery, his prior obstructive symptoms have resolved.  He has some variation in his bowel habits and occasional lower abdominal pain with some urgency at times.  Unclear if this is postsurgical functional change versus recurrence of his Crohn's disease.  Ultimately I am recommending a colonoscopy to survey his disease, assess for active inflammation, and make recommendations on whether or not medical therapy is warranted at this time.  I discussed risk and benefits of colonoscopy and anesthesia with him and he want to proceed.  Further recommendations pending results.  In the interim we will check a CBC today to check for anemia, leukocytosis, etc.  Previously during his last evaluation his ESR and CRP were  normal.  [place] Cellar, MD [facility]

## 2020-06-29 ENCOUNTER — Encounter: Payer: Self-pay | Admitting: Gastroenterology

## 2020-07-02 ENCOUNTER — Other Ambulatory Visit: Payer: Self-pay

## 2020-07-02 ENCOUNTER — Encounter: Payer: Self-pay | Admitting: Gastroenterology

## 2020-07-02 ENCOUNTER — Ambulatory Visit (AMBULATORY_SURGERY_CENTER): Payer: BC Managed Care – PPO | Admitting: Gastroenterology

## 2020-07-02 VITALS — BP 96/53 | HR 51 | Temp 97.9°F | Resp 11 | Ht 74.0 in | Wt 191.0 lb

## 2020-07-02 DIAGNOSIS — K5730 Diverticulosis of large intestine without perforation or abscess without bleeding: Secondary | ICD-10-CM

## 2020-07-02 DIAGNOSIS — K50012 Crohn's disease of small intestine with intestinal obstruction: Secondary | ICD-10-CM

## 2020-07-02 DIAGNOSIS — K5090 Crohn's disease, unspecified, without complications: Secondary | ICD-10-CM | POA: Diagnosis not present

## 2020-07-02 DIAGNOSIS — K529 Noninfective gastroenteritis and colitis, unspecified: Secondary | ICD-10-CM | POA: Diagnosis not present

## 2020-07-02 DIAGNOSIS — K649 Unspecified hemorrhoids: Secondary | ICD-10-CM

## 2020-07-02 MED ORDER — SODIUM CHLORIDE 0.9 % IV SOLN: 500.0000 mL | Freq: Once | INTRAVENOUS | Status: DC

## 2020-07-02 NOTE — Progress Notes (Signed)
A/ox3, pleased with MAC, report to RN 

## 2020-07-02 NOTE — Op Note (Signed)
[place] Patient Name: James Ortiz Procedure Date: 07/02/2020 1:22 PM MRN: [id number] Endoscopist: Remo Lipps P. Havery Moros , MD Age: 34 Referring MD:  Date of Birth: Nov 02, 1986 Gender: Male Account #: [id number] Procedure:                Colonoscopy Indications:              Follow-up of Crohn's disease of the small bowel -                            s/p ileocecectomy, not on any medical therapy, here                            to restage disease with colonoscopy Medicines:                Monitored Anesthesia Care Procedure:                Pre-Anesthesia Assessment:                           - Prior to the procedure, a History and Physical                            was performed, and patient medications and                            allergies were reviewed. The patient's tolerance of                            previous anesthesia was also reviewed. The risks                            and benefits of the procedure and the sedation                            options and risks were discussed with the patient.                            All questions were answered, and informed consent                            was obtained. Prior Anticoagulants: The patient has                            taken no previous anticoagulant or antiplatelet                            agents. ASA Grade Assessment: II - A patient with                            mild systemic disease. After reviewing the risks                            and benefits, the patient was deemed in  satisfactory condition to undergo the procedure.                           After obtaining informed consent, the colonoscope                            was passed under direct vision. Throughout the                            procedure, the patient's blood pressure, pulse, and                            oxygen saturations were monitored continuously. The                            Colonoscope was  introduced through the anus and                            advanced to the the terminal ileum. The colonoscopy                            was performed without difficulty. The patient                            tolerated the procedure well. The quality of the                            bowel preparation was good. The terminal ileum and                            the rectum, surgical anastomosis were photographed. Scope In: 1:32:21 PM Scope Out: 1:48:52 PM Scope Withdrawal Time: 0 hours 13 minutes 44 seconds  Total Procedure Duration: 0 hours 16 minutes 31 seconds  Findings:                 The perianal and digital rectal examinations were                            normal.                           There was evidence of a prior end-to-side                            ileo-colonic anastomosis in the ascending colon.                            This was patent and was characterized by a few                            apthous ulcerations but was widely patent.                           Patchy inflammation, graded as Rutgeerts Score i2                            (  more than five aphthous lesions with normal                            intervening mucosa or skip areas of larger lesions                            or lesions confined to the ileocolonic anastomosis)                            and characterized by erosions and aphthous                            ulcerations was found in the terminal ileum.                           A few isolated erosions were found in the                            descending colon, in the transverse colon and in                            the ascending colon. Biopsies were taken with a                            cold forceps for histology.                           Scattered small-mouthed diverticula were found in                            the transverse colon and left colon.                           Internal hemorrhoids were found during retroflexion.                            The exam was otherwise without abnormality. Complications:            No immediate complications. Estimated blood loss:                            Minimal. Estimated Blood Loss:     Estimated blood loss was minimal. Impression:               - Patent end-to-side ileo-colonic anastomosis,                            characterized by inflammation.                           - Crohn's disease with ileitis. Rutgeert's score i2.                           - A few erosions in the descending colon, in the  transverse colon and in the ascending colon.                            Biopsied.                           - Diverticulosis in the transverse colon and in the                            left colon.                           - Internal hemorrhoids.                           - The examination was otherwise normal.                           Based on endoscopic findings I think the patient                            would benefit from medical therapy for Crohn's,                            will discuss options. Recommendation:           - Patient has a contact number available for                            emergencies. The signs and symptoms of potential                            delayed complications were discussed with the                            patient. Return to normal activities tomorrow.                            Written discharge instructions were provided to the                            patient.                           - Resume previous diet.                           - Continue present medications.                           - Await pathology results. Remo Lipps P. Irelyn Perfecto, MD 07/02/2020 2:00:51 PM This report has been signed electronically.

## 2020-07-02 NOTE — Patient Instructions (Signed)
HANDOUTS PROVIDED ON: DIVERTICULOSIS & HEMORRHOIDS  The biopsies taken today have been sent for pathology.  The results can take 1-3 weeks to receive.  When your next colonoscopy should occur will be based on the pathology results.    You may resume your previous diet and medication schedule.  Thank you for allowing [place] to care for you today!!!   YOU HAD AN ENDOSCOPIC PROCEDURE TODAY AT [place]:   Refer to the procedure report that was given to you for any specific questions about what was found during the examination.  If the procedure report does not answer your questions, please call your gastroenterologist to clarify.  If you requested that your care partner not be given the details of your procedure findings, then the procedure report has been included in a sealed envelope for you to review at your convenience later.  YOU SHOULD EXPECT: Some feelings of bloating in the abdomen. Passage of more gas than usual.  Walking can help get rid of the air that was put into your GI tract during the procedure and reduce the bloating. If you had a lower endoscopy (such as a colonoscopy or flexible sigmoidoscopy) you may notice spotting of blood in your stool or on the toilet paper. If you underwent a bowel prep for your procedure, you may not have a normal bowel movement for a few days.  Please Note:  You might notice some irritation and congestion in your nose or some drainage.  This is from the oxygen used during your procedure.  There is no need for concern and it should clear up in a day or so.  SYMPTOMS TO REPORT IMMEDIATELY:   Following lower endoscopy (colonoscopy or flexible sigmoidoscopy):  Excessive amounts of blood in the stool  Significant tenderness or worsening of abdominal pains  Swelling of the abdomen that is new, acute  Fever of 100F or higher  For urgent or emergent issues, a gastroenterologist can be reached at any hour by calling [phone]. Do not use  MyChart messaging for urgent concerns.    DIET:  We do recommend a small meal at first, but then you may proceed to your regular diet.  Drink plenty of fluids but you should avoid alcoholic beverages for 24 hours.  ACTIVITY:  You should plan to take it easy for the rest of today and you should NOT DRIVE or use heavy machinery until tomorrow (because of the sedation medicines used during the test).    FOLLOW UP: Our staff will call the number listed on your records 48-72 hours following your procedure to check on you and address any questions or concerns that you may have regarding the information given to you following your procedure. If we do not reach you, we will leave a message.  We will attempt to reach you two times.  During this call, we will ask if you have developed any symptoms of COVID 19. If you develop any symptoms (ie: fever, flu-like symptoms, shortness of breath, cough etc.) before then, please call [phone].  If you test positive for Covid 19 in the 2 weeks post procedure, please call and report this information to [place].    If any biopsies were taken you will be contacted by phone or by letter within the next 1-3 weeks.  Please call us at [phone] if you have not heard about the biopsies in 3 weeks.    SIGNATURES/CONFIDENTIALITY: You and/or your care partner have signed paperwork which will be  entered into your electronic medical record.  These signatures attest to the fact that that the information above on your After Visit Summary has been reviewed and is understood.  Full responsibility of the confidentiality of this discharge information lies with you and/or your care-partner.

## 2020-07-02 NOTE — Progress Notes (Signed)
Called to room to assist during endoscopic procedure.  Patient ID and intended procedure confirmed with present staff. Received instructions for my participation in the procedure from the performing physician.  

## 2020-07-02 NOTE — Progress Notes (Signed)
Vitals-CW Temp-JB  Pt's states no medical or surgical changes since previsit or office visit.

## 2020-07-06 ENCOUNTER — Telehealth: Payer: Self-pay

## 2020-07-06 NOTE — Telephone Encounter (Signed)
  Follow up Call-  Call back number 07/02/2020 03/01/2019  Post procedure Call Back phone  # [phone]  Permission to leave phone message Yes Yes  Some recent data might be hidden     Patient questions:  Do you have a fever, pain , or abdominal swelling? No. Pain Score  0 *  Have you tolerated food without any problems? Yes.    Have you been able to return to your normal activities? Yes.    Do you have any questions about your discharge instructions: Diet   No. Medications  No. Follow up visit  No.  Do you have questions or concerns about your Care? No.  Actions: * If pain score is 4 or above: No action needed, pain <4.  1. Have you developed a fever since your procedure? no  2.   Have you had an respiratory symptoms (SOB or cough) since your procedure? no  3.   Have you tested positive for COVID 19 since your procedure no  4.   Have you had any family members/close contacts diagnosed with the COVID 19 since your procedure?  no   If yes to any of these questions please route to Joylene John, RN and Erenest Rasher, RN

## 2020-07-14 ENCOUNTER — Encounter: Payer: Self-pay | Admitting: Gastroenterology

## 2020-07-14 ENCOUNTER — Other Ambulatory Visit (INDEPENDENT_AMBULATORY_CARE_PROVIDER_SITE_OTHER): Payer: BC Managed Care – PPO

## 2020-07-14 ENCOUNTER — Ambulatory Visit: Payer: BC Managed Care – PPO | Admitting: Gastroenterology

## 2020-07-14 VITALS — BP 108/72 | HR 72 | Ht 74.0 in | Wt 189.8 lb

## 2020-07-14 DIAGNOSIS — Z23 Encounter for immunization: Secondary | ICD-10-CM

## 2020-07-14 DIAGNOSIS — K50919 Crohn's disease, unspecified, with unspecified complications: Secondary | ICD-10-CM | POA: Diagnosis not present

## 2020-07-14 DIAGNOSIS — K9049 Malabsorption due to intolerance, not elsewhere classified: Secondary | ICD-10-CM

## 2020-07-14 DIAGNOSIS — K5080 Crohn's disease of both small and large intestine without complications: Secondary | ICD-10-CM | POA: Diagnosis not present

## 2020-07-14 DIAGNOSIS — Z111 Encounter for screening for respiratory tuberculosis: Secondary | ICD-10-CM | POA: Diagnosis not present

## 2020-07-14 LAB — IGA: IgA: 155 mg/dL (ref 68–378)

## 2020-07-14 NOTE — Progress Notes (Signed)
HPI :  34 year old male with a history of ileal Crohn's disease historically.  Diagnosed several years ago, on thiopurines > 10 years ago and not on any therapy for several years. Admitted last year with an obstruction at the ileum / IC valve. Follow up colonoscopy with me March 2020 showed a distorted IC valve / stenosis at the ileum which could not be traversed. He was referred to Renelda Mom at [facility] who performed ileocecectomy in April 2020. Surgery went quite well and his obstructive symptoms resolved. Path from his operative report showed chronic active inflammation but no pre-cancerous changes.   Historically he has wanted to avoid biologic or medical therapy for his Crohn's disease so we monitored post operatively.  When I saw him in June he had been complaining of occasional lower abdominal cramping and urgency with his stools.  He has in particular some food sensitivities such as beer and certain alcohol that bothers him.  Since his last visit he underwent a colonoscopy with me on July 15.  Findings as outlined below, he had active inflammation at the anastomosis and ileum, Rutgeert's score i2.  Unfortunately through his colon he also had numerous erosions concerning for mild colonic activity.  Biopsies confirmed Crohn's colitis in these areas as well.  He is here to discuss options for medical therapy given findings on colonoscopy.  He is agreeable to medical therapy at this time.  He has had negative viral hepatitis testing last year as well as TB testing.  He previously had a TPMT enzyme assay which was normal.  He has questions about other food sensitivities, his mother inquires about IgA deficiency    Colonoscopy 03/01/19 - The perianal and digital rectal examinations were normal. - The IC valve was deformed and scarred down from Crohn's disease. A ? fistulous tract was noted just inferior to the valve. A benign-appearing, intrinsic severe stenosis was found at the ileocecal  valve. The lumen was only a few mm wide and could not be traversed. No obvious inflammatory change was noted. Biopsies were taken with a cold forceps for histology. - Polypoid lesions were found in the cecum and at the appendiceal orifice, most consistent with benign pseudoinflammatory polyps. Biopsies were taken with a cold forceps for histology. - Scattered medium-mouthed diverticula were found in the entire colon. - Internal hemorrhoids were found during retroflexion. The hemorrhoids were small. - The exam was otherwise without abnormality. No colonic inflammation   Surgery 04/12/19 - ileocecectomy - Dr. Renelda Mom Path shows chronic active inflammation, with polypoid lesion in the cecum removed - no evidence of atypical lymphoid proliferation  CT scan 06/26/19 - s/p ileocecectomy, multiple benign appearing lymph nodes in RLQ, benign small pulmonary nodule  Colonoscopy 07/02/20 - The perianal and digital rectal examinations were normal. - There was evidence of a prior end-to-side ileo-colonic anastomosis in the ascending colon. This was patent and was characterized by a few apthous ulcerations but was widely patent. - Patchy inflammation, graded as Rutgeerts Score i2 (more than five aphthous lesions with normal intervening mucosa or skip areas of larger lesions or lesions confined to the ileocolonic anastomosis) and characterized by erosions and aphthous ulcerations was found in the terminal ileum. - A few isolated erosions were found in the descending colon, in the transverse colon and in the ascending colon. Biopsies were taken with a cold forceps for histology. - Scattered small-mouthed diverticula were found in the transverse colon and left colon. - Internal hemorrhoids were found during retroflexion. -  The exam was otherwise without abnormality.  Diagnosis Surgical [P], colon - CHRONIC ACTIVE COLITIS - NO GRANULOMATA, DYSPLASIA OR MALIGNANCY IDENTIFIED - SEE COMMENT  Labs  02/01/19 - TB negative, TPMT 20.1, hep B testing negative 2020      Past Medical History:  Diagnosis Date  . Chest pain   . Contusion of rib on left side   . Corneal abrasion    non-healing, wore a permanent contact and finally removed yesterday  . Crohn's disease ([place])    ileal disease s/p ileocecectomy   . RUQ pain   . Sinusitis      Past Surgical History:  Procedure Laterality Date  . EYE SURGERY    . KNEE SURGERY     Family History  Problem Relation Age of Onset  . Crohn's disease Father   . Other Other        HEALTHY  . Other Other        HEALTHY  . Other Other        HEALTHY  . Colon cancer Neg Hx   . Esophageal cancer Neg Hx    Social History   Tobacco Use  . Smoking status: Former Smoker    Packs/day: 1.00    Years: 12.00    Pack years: 12.00  . Smokeless tobacco: Never Used  Vaping Use  . Vaping Use: Former  . Devices: trying to quit   Substance Use Topics  . Alcohol use: Yes    Comment: h/o heavy use  . Drug use: No   Current Outpatient Medications  Medication Sig Dispense Refill  . carboxymethylcellulose (REFRESH PLUS) 0.5 % SOLN Place 1 drop into the left eye 3 (three) times daily as needed.    Marland Kitchen DOXYCYCLINE PO Take 100 mg elemental calcium/kg/hr by mouth daily.     . finasteride (PROPECIA) 1 MG tablet Take 1 mg by mouth daily. Hair loss    . Multiple Vitamins-Minerals (MULTIVITAMIN WITH MINERALS) tablet Take 1 tablet by mouth daily.    . sodium chloride (MURO 128) 5 % ophthalmic ointment Place 1 application into the left eye at bedtime.    . sodium chloride (MURO 128) 5 % ophthalmic solution Place 1 drop into the left eye 3 (three) times daily.     No current facility-administered medications for this visit.   Allergies  Allergen Reactions  . Milk-Related Compounds      Review of Systems: All systems reviewed and negative except where noted in HPI.   Lab Results  Component Value Date   WBC 8.2 06/16/2020   HGB 13.2 06/16/2020    HCT 40.6 06/16/2020   MCV 79.2 06/16/2020   PLT 207.0 06/16/2020    Lab Results  Component Value Date   CREATININE 0.99 02/01/2019   BUN 12 02/01/2019   NA 139 02/01/2019   K 4.0 02/01/2019   CL 106 02/01/2019   CO2 25 02/01/2019    Lab Results  Component Value Date   ALT 13 01/31/2019   AST 21 01/31/2019   ALKPHOS 64 01/31/2019   BILITOT 1.4 (H) 01/31/2019     Physical Exam: BP 108/72   Pulse 72   Ht 6' 2"  (1.88 m)   Wt 189 lb 12.8 oz (86.1 kg)   BMI 24.37 kg/m  Constitutional: Pleasant,well-developed, male in no acute distress. Abdominal: Soft, nondistended, nontender. There are no masses palpable.  Neurological: Alert and oriented to person place and time. Skin: Skin is warm and dry. No rashes noted. Psychiatric: Normal mood  and affect. Behavior is normal.   ASSESSMENT AND PLAN: 34 year old male here for reassessment of the following issues:  Crohn's disease - ileal and colonic involvement / food intolerance -status post ileocecectomy for fibrotic stricture last year.  He has been doing okay postoperatively however with some urgency of his stools that has persisted.  Historically he has wanted to avoid medical therapy, we performed a surveillance colonoscopy to restage his disease, unfortunately he has moderately active ileal disease and now with new evidence of colonic involvement.  We discussed the findings, I am concerned Crohn's is active and location now involves his colon, likely causing his symptoms at this point and he is at risk for progression of disease and need for recurrent surgery without medical therapy.  We had a lengthy discussion about medical options to treat his Crohn's disease.  I reviewed the new AGA guidelines with him, which recommends combination therapy with anti-TNF plus thiopurine for biologic nave patients with moderate to severe disease.  I explained what anti-TNF agents are as well as thiopurine's, risks of infection, lymphoma, variety of  other potential risks, all of which are relatively low.  We also discussed other biologics to include Stelara and Entyvio.  If he wants to avoid thiopurines I would recommend Stelara monotherapy.  He could also go on monotherapy with Humira or Remicade although we discussed that combination therapy is recommended initially with thiopurine to reduce immunogenicity risk of the biologic.  He travels a lot for work, he would prefer the convenience of injection rather than infusion.  In this light we will ask his insurance about coverage for Humira and Stelara in regards to cost and will get back with him regarding these options.  I will send him for QuantiFERON gold today to ensure negative prior to starting.  He inquires about IgA deficiency and his food intolerance, I offered to screen him for that although I think it is unlikely given he has not had any recurrent bowel infections that I am aware of.  I will also screen for celiac disease given his beer and other food intolerances although I think the likelihood of that is also low, suspect more likely due to active Crohn's disease.  I reviewed his vaccination history, recommending PCV 13 vaccine today in preparation of biologic.  He is already vaccinated to COVID-19.  I will await labs and get back to with him regarding her insurance coverage of Humira and Stelara and will make a decision in the next week or 2 and move forward.  He agreed, all questions answered  Powell Cellar, MD [facility]

## 2020-07-14 NOTE — Patient Instructions (Addendum)
If you are age 35 or older, your body mass index should be between 23-30. Your Body mass index is 24.37 kg/m. If this is out of the aforementioned range listed, please consider follow up with your Primary Care Provider.  If you are age 84 or younger, your body mass index should be between 19-25. Your Body mass index is 24.37 kg/m. If this is out of the aformentioned range listed, please consider follow up with your Primary Care Provider.   Please go to the lab in the basement of our building to have lab work done as you leave today. Hit "B" for basement when you get on the elevator.  When the doors open the lab is on your left.  We will call you with the results. Thank you.  Due to recent changes in healthcare laws, you may see the results of your imaging and laboratory studies on MyChart before your provider has had a chance to review them.  We understand that in some cases there may be results that are confusing or concerning to you. Not all laboratory results come back in the same time frame and the provider may be waiting for multiple results in order to interpret others.  Please give [place] 48 hours in order for your provider to thoroughly review all the results before contacting the office for clarification of your results.   We are giving you a Pneumococcal 13 vaccine today.   Thank you for entrusting me with your care and for choosing [facility], Dr. [place] Cellar

## 2020-07-17 ENCOUNTER — Telehealth: Payer: Self-pay

## 2020-07-17 DIAGNOSIS — K5080 Crohn's disease of both small and large intestine without complications: Secondary | ICD-10-CM

## 2020-07-17 DIAGNOSIS — Z79899 Other long term (current) drug therapy: Secondary | ICD-10-CM

## 2020-07-17 LAB — TISSUE TRANSGLUTAMINASE, IGA: (tTG) Ab, IgA: 1 U/mL

## 2020-07-17 LAB — QUANTIFERON-TB GOLD PLUS
Mitogen-NIL: >10 IU/mL
NIL: 0.04 IU/mL
QuantiFERON-TB Gold Plus: NEGATIVE
TB1-NIL: 0 IU/mL
TB2-NIL: 0.01 IU/mL

## 2020-07-17 NOTE — Telephone Encounter (Signed)
-----   Message from Yetta Flock, MD sent at 07/15/2020  1:59 PM EDT ----- Regarding: RE: new biologic Oh okay. Any way you could call him to see which one he wants [place] to run, he was going back and forth. Option 1 was Humira + Imuran - if he is willing to do that, I think Humira would be cheaper. Option 2 is Stelara by itself, may be more expensive. Thanks for your help, he wanted to think about it and thought pricing it out may help his decision.  ----- Message ----- From: Marlon Pel, RN Sent: 07/15/2020   8:40 AM EDT To: Yetta Flock, MD Subject: RE: new biologic                               No need to choose one.  Will not know the price of the drug until it is approved.  They will only approve one drug at a time.   ----- Message ----- From: Yetta Flock, MD Sent: 07/15/2020   7:34 AM EDT To: Marlon Pel, RN Subject: new biologic                                   Hi Taleya Whitcher, I am hoping to start this patient on either Humira or Stelara. I was wondering if there is a way we can put these through his insurance to see how much these would be for him so he can make a decision on how he wishes to proceed. Can you let me know? Thanks

## 2020-07-17 NOTE — Telephone Encounter (Signed)
Left message for patient to call back  

## 2020-07-20 MED ORDER — HUMIRA (2 PEN) 40 MG/0.4ML ~~LOC~~ AJKT: 40.0000 mg | AUTO-INJECTOR | SUBCUTANEOUS | 6 refills | Status: DC

## 2020-07-20 MED ORDER — HUMIRA-CD/UC/HS STARTER 80 MG/0.8ML ~~LOC~~ AJKT
160.0000 mg | AUTO-INJECTOR | Freq: Once | SUBCUTANEOUS | 0 refills | Status: DC
Start: 2020-07-20 — End: 2020-07-20

## 2020-07-20 MED ORDER — MERCAPTOPURINE 50 MG PO TABS
100.0000 mg | ORAL_TABLET | Freq: Every day | ORAL | 1 refills | Status: DC
Start: 2020-07-20 — End: 2020-09-29

## 2020-07-20 MED ORDER — HUMIRA-CD/UC/HS STARTER 80 MG/0.8ML ~~LOC~~ AJKT: 160.0000 mg | AUTO-INJECTOR | Freq: Once | SUBCUTANEOUS | 0 refills | Status: DC

## 2020-07-20 MED ORDER — HUMIRA (2 PEN) 40 MG/0.4ML ~~LOC~~ AJKT
40.0000 mg | AUTO-INJECTOR | SUBCUTANEOUS | 6 refills | Status: DC
Start: 2020-07-20 — End: 2020-07-20

## 2020-07-20 NOTE — Telephone Encounter (Signed)
Patient notified Humira approved.  Rx sent to his mail order pharmacy.  He is notified to pick up new 26m rx at his local pharmacy and to come for labs in 2 weeks.

## 2020-07-20 NOTE — Telephone Encounter (Signed)
Prior auth initiated for Humira.  I will send in Rx for Imuran if approved

## 2020-07-20 NOTE — Addendum Note (Signed)
Addended by: Marlon Pel on: 07/20/2020 03:27 PM   Modules accepted: Orders

## 2020-07-20 NOTE — Telephone Encounter (Signed)
Thanks Sheri, He weighs 86 kg, for ease of dosing let's put him on 6MP 138m per day which is 1-1.5 mg/kg, easier than 2038mof Imuran per day. He will need to come back for CBC and LFTs in 2 weeks after he starts the 6MP. Thank you for your help with this, hopefully he is okay to be on this regimen given his insurance preference.

## 2020-07-20 NOTE — Addendum Note (Signed)
Addended by: Marlon Pel on: 07/20/2020 12:25 PM   Modules accepted: Orders

## 2020-07-20 NOTE — Telephone Encounter (Signed)
insurance requires step therapy and will need to try Humira first.  Rx sent.  Patient is aware.  Dr. Havery Moros please advise dose of Imuran.

## 2020-08-10 IMAGING — CT CT ABD-PELV W/ CM
2 of 4 series · 15 of 46 positions shown, 17 images · IV contrast (APPLIED)
Comparison: None.

CLINICAL DATA: Severe generalized abdominal pain for 2 weeks with
intermittent nausea and vomiting.

EXAM:
CT ABDOMEN AND PELVIS WITH CONTRAST
TECHNIQUE: Multidetector CT imaging of the abdomen and pelvis was performed
using the standard protocol following bolus administration of
intravenous contrast.
CONTRAST:  100mL OMNIPAQUE IOHEXOL 300 MG/ML  SOLN

[Series 3: abdomen 5.0 · axial · 0.85mm/px · z∈[-438,+7]mm · 12 of 99 slices shown, 14 images]
[im 5/99  soft-tissue]
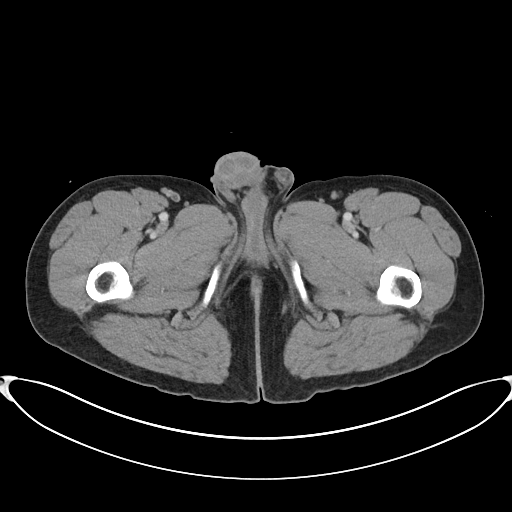
[im 5/99  bone]
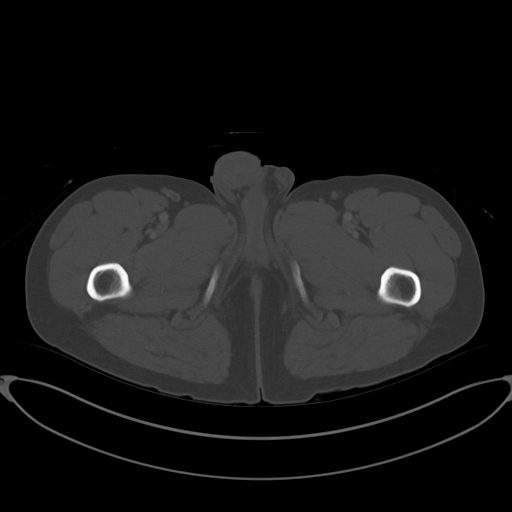
[im 15/99  soft-tissue]
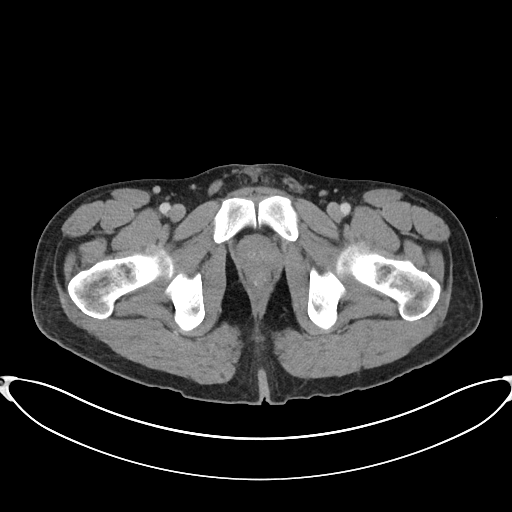
[im 20/99  soft-tissue]
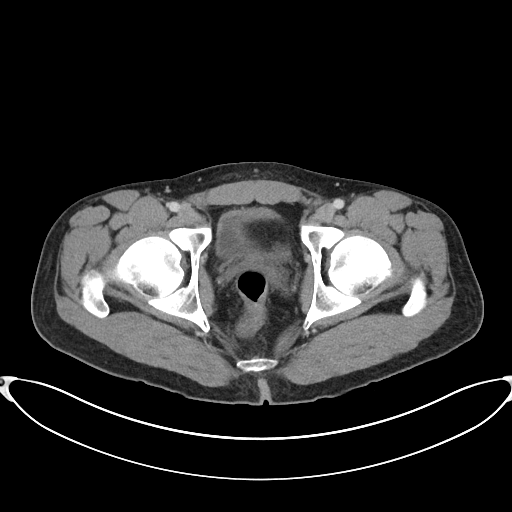
[im 30/99  soft-tissue]
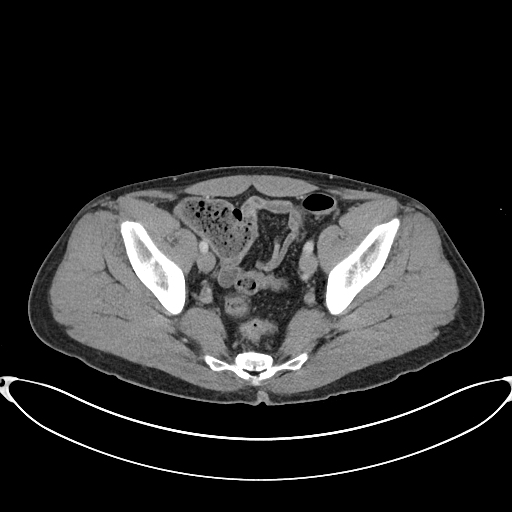
[im 40/99  soft-tissue]
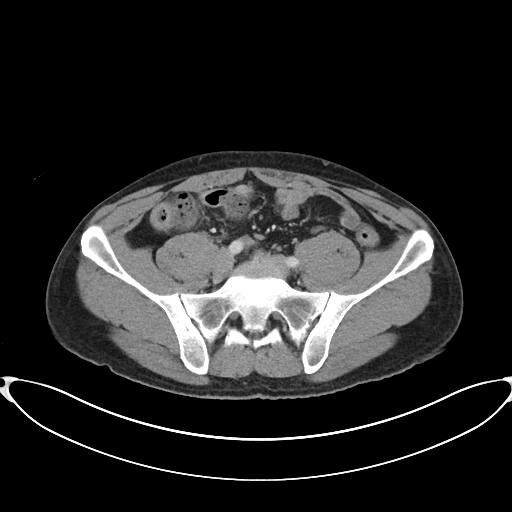
[im 45/99  soft-tissue]
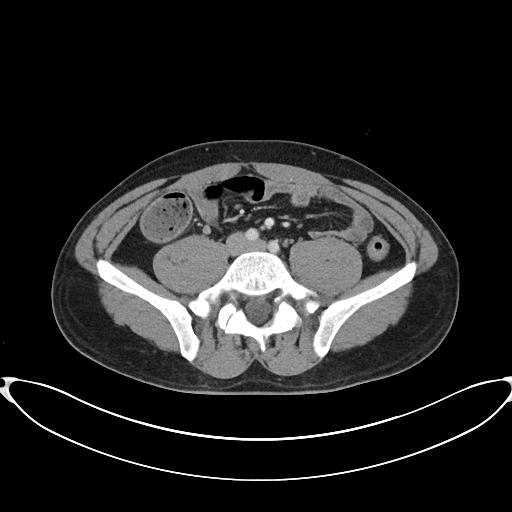
[im 54/99  soft-tissue]
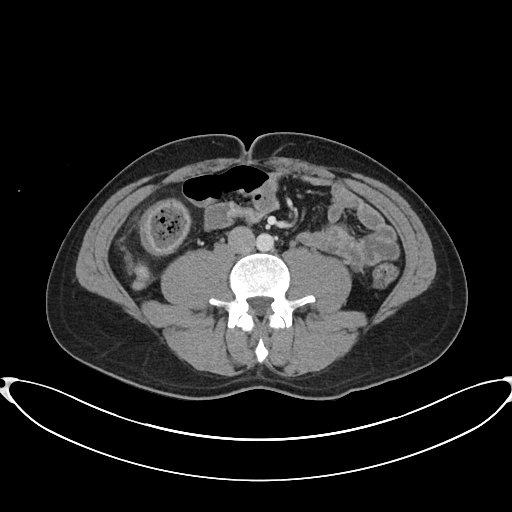
[im 59/99  soft-tissue]
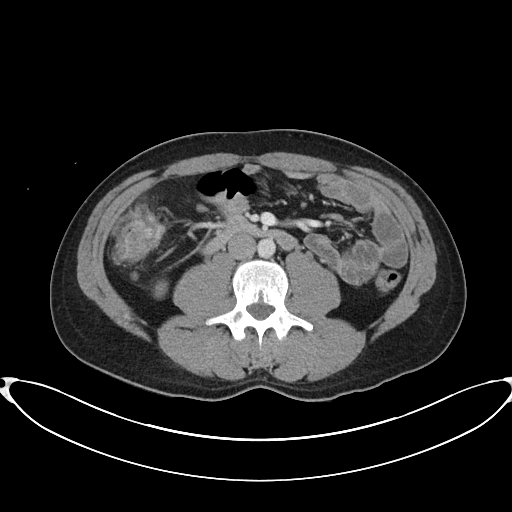
[im 69/99  soft-tissue]
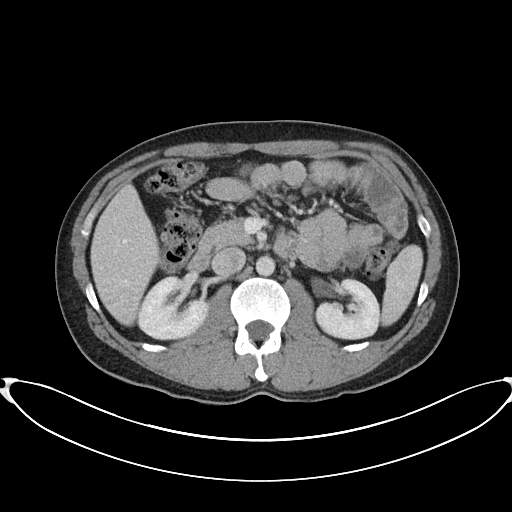
[im 69/99  bone]
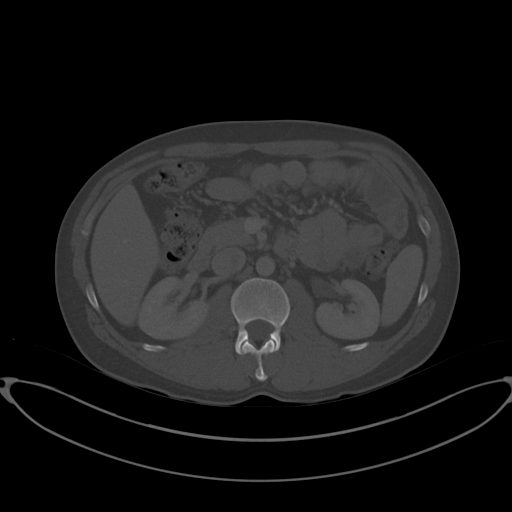
[im 79/99  soft-tissue]
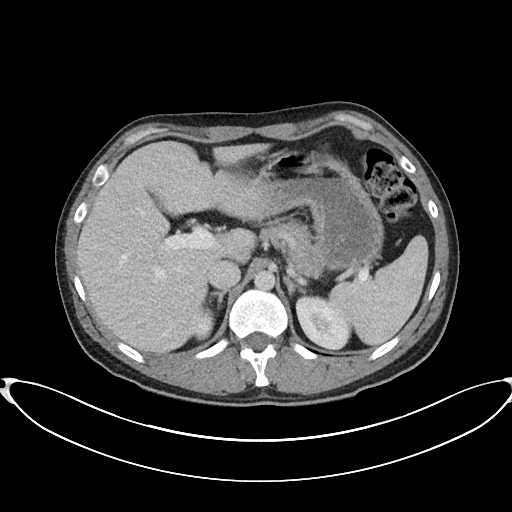
[im 84/99  soft-tissue]
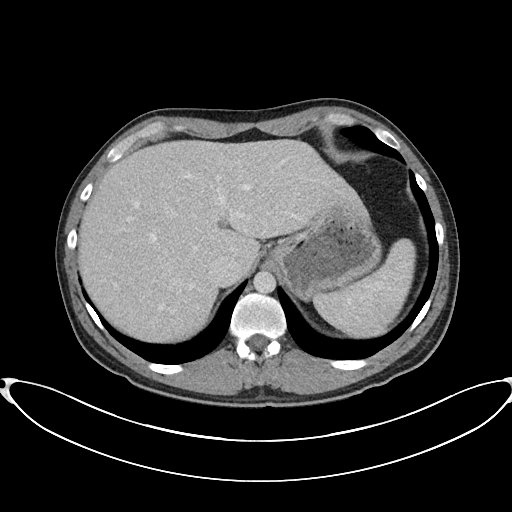
[im 94/99  soft-tissue]
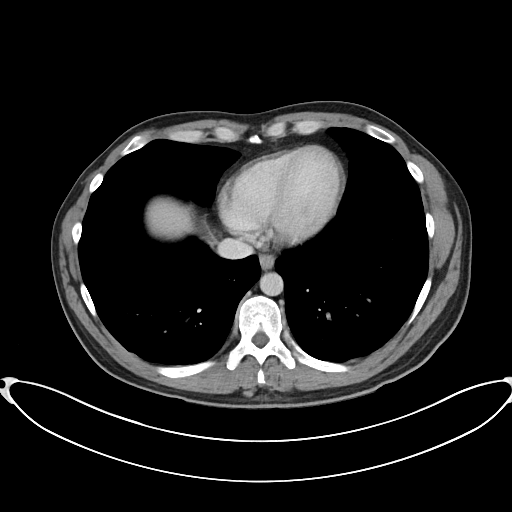

[Series 6: abdomen 3.0 mpr cor · coronal · 0.80mm/px · 3 of 101 slices shown]
[im 34/101  soft-tissue]
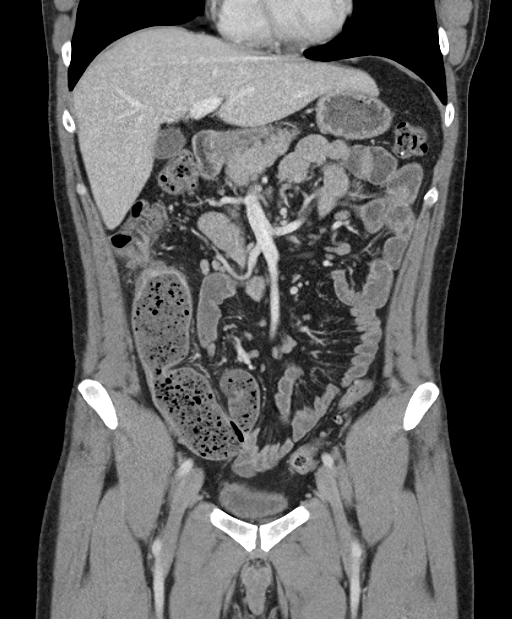
[im 45/101  soft-tissue]
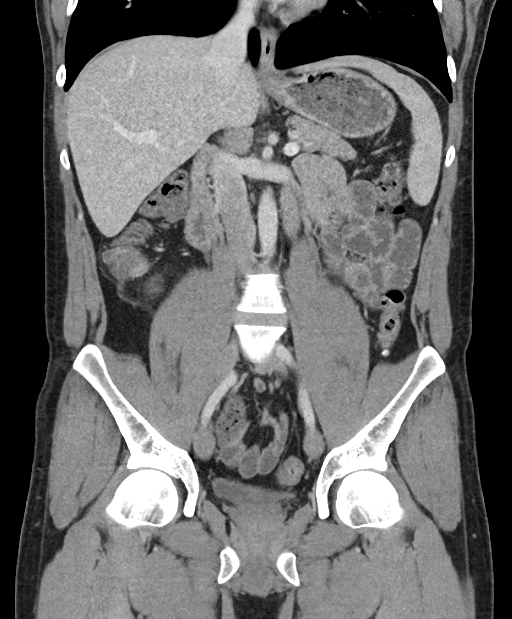
[im 56/101  soft-tissue]
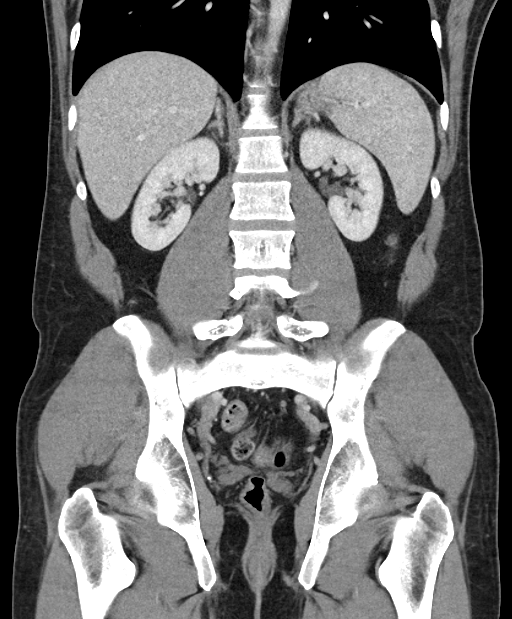

[15 of 46 positions shown; findings below may reference images not displayed]

FINDINGS: Lower chest: Unremarkable.

Hepatobiliary: No suspicious focal abnormality within the liver
parenchyma. There is no evidence for gallstones, gallbladder wall
thickening, or pericholecystic fluid. No intrahepatic or
extrahepatic biliary dilation.

Pancreas: No focal mass lesion. No dilatation of the main duct. No
intraparenchymal cyst. No peripancreatic edema.

Spleen: No splenomegaly. No focal mass lesion.

Adrenals/Urinary Tract: No adrenal nodule or mass. Kidneys
unremarkable. No evidence for hydroureter. The urinary bladder
appears normal for the degree of distention.

Stomach/Bowel: Stomach is unremarkable. No gastric wall thickening.
No evidence of outlet obstruction. Duodenum is normally positioned
as is the ligament of Treitz. No small bowel wall thickening. No
small bowel dilatation. Distal and terminal ileum is dilated up to
4.6 cm diameter. Fecalization of enteric contents noted in the
distal and terminal ileum. There is mucosal hyperenhancement in the
terminal ileum extending into the ileocecal valve. The appendix is
dilated up to 12 mm and is also hyperemic. Right colon is
decompressed. No gross colonic mass. No colonic wall thickening.

Vascular/Lymphatic: No abdominal aortic aneurysm. No abdominal
aortic atherosclerotic calcification. There is no gastrohepatic or
hepatoduodenal ligament lymphadenopathy. No intraperitoneal or
retroperitoneal lymphadenopathy. Increased number of lymph nodes are
seen in the central small bowel and ileocolic mesentery. No pelvic
sidewall lymphadenopathy.

Reproductive: The prostate gland and seminal vesicles are
unremarkable.

Other: No intraperitoneal free fluid.

Musculoskeletal: No worrisome lytic or sclerotic osseous
abnormality.
IMPRESSION: 1. The distal 20-30 cm of ileum is distended up to 4.5 cm diameter.
This distended distal ileum shows fecalization of enteric contents
consistent with decreased motility/transit. The terminal ileum shows
ab the normal enhancement in the mucosa that extends into the
ileocecal valve with some apparent hyperenhancement in the adjacent
cecum. Imaging features suggest infectious/inflammatory process in
the terminal ileum/ileocecal valve with inflammatory stricture of
the terminal ileum.
2. The appendix is dilated, thick walled, and hyperenhancing with
surrounding periappendiceal edema/inflammation. As such, imaging
features suggest acute appendicitis. However, the changes in the
appendix do not appear to be the cause of the terminal ileal
abnormality and while acute appendicitis can not be excluded by
imaging, it is possible that the changes in the appendix are due to
the same process involving the terminal ileum.
3. Increased number of upper normal lymph nodes identified in the
ileocolic and central small bowel mesentery.
4. No intraperitoneal free fluid. No mesenteric or intraperitoneal
abscess.

## 2020-08-17 ENCOUNTER — Other Ambulatory Visit (INDEPENDENT_AMBULATORY_CARE_PROVIDER_SITE_OTHER): Payer: BC Managed Care – PPO

## 2020-08-17 DIAGNOSIS — K5080 Crohn's disease of both small and large intestine without complications: Secondary | ICD-10-CM

## 2020-08-17 DIAGNOSIS — Z79899 Other long term (current) drug therapy: Secondary | ICD-10-CM | POA: Diagnosis not present

## 2020-08-17 LAB — CBC
HCT: 40.2 % (ref 39.0–52.0)
Hemoglobin: 13 g/dL (ref 13.0–17.0)
MCHC: 32.4 g/dL (ref 30.0–36.0)
MCV: 79.4 fl (ref 78.0–100.0)
Platelets: 207 10*3/uL (ref 150.0–400.0)
RBC: 5.07 Mil/uL (ref 4.22–5.81)
RDW: 13.9 % (ref 11.5–15.5)
WBC: 5.1 10*3/uL (ref 4.0–10.5)

## 2020-08-17 LAB — HEPATIC FUNCTION PANEL
ALT: 34 U/L (ref 0–53)
AST: 21 U/L (ref 0–37)
Albumin: 4.3 g/dL (ref 3.5–5.2)
Alkaline Phosphatase: 45 U/L (ref 39–117)
Bilirubin, Direct: 0.2 mg/dL (ref 0.0–0.3)
Total Bilirubin: 1.2 mg/dL (ref 0.2–1.2)
Total Protein: 6.9 g/dL (ref 6.0–8.3)

## 2020-08-19 ENCOUNTER — Other Ambulatory Visit: Payer: Self-pay

## 2020-08-19 DIAGNOSIS — Z79899 Other long term (current) drug therapy: Secondary | ICD-10-CM

## 2020-08-19 DIAGNOSIS — K5080 Crohn's disease of both small and large intestine without complications: Secondary | ICD-10-CM

## 2020-08-20 ENCOUNTER — Ambulatory Visit: Payer: BC Managed Care – PPO | Admitting: Gastroenterology

## 2020-09-14 ENCOUNTER — Other Ambulatory Visit (INDEPENDENT_AMBULATORY_CARE_PROVIDER_SITE_OTHER): Payer: BC Managed Care – PPO

## 2020-09-14 DIAGNOSIS — Z79899 Other long term (current) drug therapy: Secondary | ICD-10-CM | POA: Diagnosis not present

## 2020-09-14 DIAGNOSIS — K5080 Crohn's disease of both small and large intestine without complications: Secondary | ICD-10-CM

## 2020-09-14 LAB — HEPATIC FUNCTION PANEL
ALT: 74 U/L — ABNORMAL HIGH (ref 0–53)
AST: 29 U/L (ref 0–37)
Albumin: 4.5 g/dL (ref 3.5–5.2)
Alkaline Phosphatase: 53 U/L (ref 39–117)
Bilirubin, Direct: 0.2 mg/dL (ref 0.0–0.3)
Total Bilirubin: 1.5 mg/dL — ABNORMAL HIGH (ref 0.2–1.2)
Total Protein: 7.1 g/dL (ref 6.0–8.3)

## 2020-09-14 LAB — CBC WITH DIFFERENTIAL/PLATELET
Basophils Absolute: 0 10*3/uL (ref 0.0–0.1)
Basophils Relative: 0.8 % (ref 0.0–3.0)
Eosinophils Absolute: 0.1 10*3/uL (ref 0.0–0.7)
Eosinophils Relative: 2.4 % (ref 0.0–5.0)
HCT: 39.6 % (ref 39.0–52.0)
Hemoglobin: 13 g/dL (ref 13.0–17.0)
Lymphocytes Relative: 49.8 % — ABNORMAL HIGH (ref 12.0–46.0)
Lymphs Abs: 2.2 10*3/uL (ref 0.7–4.0)
MCHC: 32.9 g/dL (ref 30.0–36.0)
MCV: 82.6 fl (ref 78.0–100.0)
Monocytes Absolute: 0.3 10*3/uL (ref 0.1–1.0)
Monocytes Relative: 6.5 % (ref 3.0–12.0)
Neutro Abs: 1.8 10*3/uL (ref 1.4–7.7)
Neutrophils Relative %: 40.5 % — ABNORMAL LOW (ref 43.0–77.0)
Platelets: 245 10*3/uL (ref 150.0–400.0)
RBC: 4.8 Mil/uL (ref 4.22–5.81)
RDW: 16.8 % — ABNORMAL HIGH (ref 11.5–15.5)
WBC: 4.5 10*3/uL (ref 4.0–10.5)

## 2020-09-15 ENCOUNTER — Other Ambulatory Visit: Payer: Self-pay

## 2020-09-15 DIAGNOSIS — R748 Abnormal levels of other serum enzymes: Secondary | ICD-10-CM

## 2020-09-21 ENCOUNTER — Other Ambulatory Visit (INDEPENDENT_AMBULATORY_CARE_PROVIDER_SITE_OTHER): Payer: BC Managed Care – PPO

## 2020-09-21 DIAGNOSIS — R748 Abnormal levels of other serum enzymes: Secondary | ICD-10-CM

## 2020-09-21 LAB — HEPATIC FUNCTION PANEL
ALT: 62 U/L — ABNORMAL HIGH (ref 0–53)
AST: 27 U/L (ref 0–37)
Albumin: 4.3 g/dL (ref 3.5–5.2)
Alkaline Phosphatase: 54 U/L (ref 39–117)
Bilirubin, Direct: 0.3 mg/dL (ref 0.0–0.3)
Total Bilirubin: 2 mg/dL — ABNORMAL HIGH (ref 0.2–1.2)
Total Protein: 7 g/dL (ref 6.0–8.3)

## 2020-09-22 ENCOUNTER — Other Ambulatory Visit: Payer: Self-pay

## 2020-09-22 DIAGNOSIS — R748 Abnormal levels of other serum enzymes: Secondary | ICD-10-CM

## 2020-09-22 DIAGNOSIS — Z79899 Other long term (current) drug therapy: Secondary | ICD-10-CM

## 2020-09-23 ENCOUNTER — Other Ambulatory Visit: Payer: BC Managed Care – PPO

## 2020-09-23 DIAGNOSIS — Z79899 Other long term (current) drug therapy: Secondary | ICD-10-CM

## 2020-09-23 DIAGNOSIS — B189 Chronic viral hepatitis, unspecified: Secondary | ICD-10-CM | POA: Diagnosis not present

## 2020-09-23 DIAGNOSIS — R748 Abnormal levels of other serum enzymes: Secondary | ICD-10-CM

## 2020-09-28 ENCOUNTER — Other Ambulatory Visit: Payer: BC Managed Care – PPO

## 2020-09-29 ENCOUNTER — Other Ambulatory Visit: Payer: Self-pay | Admitting: Gastroenterology

## 2020-09-29 ENCOUNTER — Other Ambulatory Visit: Payer: Self-pay

## 2020-09-29 DIAGNOSIS — K5080 Crohn's disease of both small and large intestine without complications: Secondary | ICD-10-CM

## 2020-09-29 DIAGNOSIS — R748 Abnormal levels of other serum enzymes: Secondary | ICD-10-CM

## 2020-09-29 DIAGNOSIS — Z79899 Other long term (current) drug therapy: Secondary | ICD-10-CM

## 2020-09-29 LAB — THIOPURINE METABOLITES
6 MMP(6-Methylmercaptopurine): 11546 pmol/8x10(8)RBC — ABNORMAL HIGH (ref <5700)
6 TG(6-Thioguanine): 188 pmol/8x10(8)RBC — ABNORMAL LOW (ref 235–400)

## 2020-09-29 LAB — HEPATITIS C ANTIBODY
Hepatitis C Ab: NONREACTIVE
SIGNAL TO CUT-OFF: 0.01 (ref <1.00)

## 2020-09-29 LAB — HEPATITIS B SURFACE ANTIGEN: Hepatitis B Surface Ag: NONREACTIVE

## 2020-09-29 NOTE — Telephone Encounter (Signed)
Dr. Loni Muse - is patient to continue on mercaptopurine?  Also pt was under the impression you wanted additional labs but I don't see any indication you ordered anything other than what he had done on recently had done on 10-6.  Please advise. Thank you.

## 2020-09-29 NOTE — Progress Notes (Signed)
LFTs due week of 10-05-20.  MyChart message sent to patient.

## 2020-09-29 NOTE — Telephone Encounter (Signed)
I would continue it for now. I am awaiting results of labs done on 10/6. I would like him to repeat LFTs next week and will let him know if he should continue or not. Thanks

## 2020-10-07 ENCOUNTER — Other Ambulatory Visit: Payer: Self-pay | Admitting: Gastroenterology

## 2020-10-07 ENCOUNTER — Other Ambulatory Visit (INDEPENDENT_AMBULATORY_CARE_PROVIDER_SITE_OTHER): Payer: BC Managed Care – PPO

## 2020-10-07 DIAGNOSIS — R748 Abnormal levels of other serum enzymes: Secondary | ICD-10-CM | POA: Diagnosis not present

## 2020-10-07 DIAGNOSIS — K5080 Crohn's disease of both small and large intestine without complications: Secondary | ICD-10-CM | POA: Diagnosis not present

## 2020-10-07 DIAGNOSIS — K5000 Crohn's disease of small intestine without complications: Secondary | ICD-10-CM

## 2020-10-07 DIAGNOSIS — Z79899 Other long term (current) drug therapy: Secondary | ICD-10-CM

## 2020-10-07 LAB — HEPATIC FUNCTION PANEL
ALT: 219 U/L — ABNORMAL HIGH (ref 0–53)
AST: 94 U/L — ABNORMAL HIGH (ref 0–37)
Albumin: 4.6 g/dL (ref 3.5–5.2)
Alkaline Phosphatase: 59 U/L (ref 39–117)
Bilirubin, Direct: 0.2 mg/dL (ref 0.0–0.3)
Total Bilirubin: 1.2 mg/dL (ref 0.2–1.2)
Total Protein: 7.3 g/dL (ref 6.0–8.3)

## 2020-10-09 ENCOUNTER — Other Ambulatory Visit: Payer: Self-pay

## 2020-10-09 ENCOUNTER — Other Ambulatory Visit (INDEPENDENT_AMBULATORY_CARE_PROVIDER_SITE_OTHER): Payer: BC Managed Care – PPO

## 2020-10-09 DIAGNOSIS — K5000 Crohn's disease of small intestine without complications: Secondary | ICD-10-CM | POA: Diagnosis not present

## 2020-10-09 DIAGNOSIS — R748 Abnormal levels of other serum enzymes: Secondary | ICD-10-CM

## 2020-10-09 LAB — HEPATIC FUNCTION PANEL
ALT: 262 U/L — ABNORMAL HIGH (ref 0–53)
AST: 86 U/L — ABNORMAL HIGH (ref 0–37)
Albumin: 4.3 g/dL (ref 3.5–5.2)
Alkaline Phosphatase: 55 U/L (ref 39–117)
Bilirubin, Direct: 0.3 mg/dL (ref 0.0–0.3)
Total Bilirubin: 1.7 mg/dL — ABNORMAL HIGH (ref 0.2–1.2)
Total Protein: 6.7 g/dL (ref 6.0–8.3)

## 2020-10-09 LAB — CBC WITH DIFFERENTIAL/PLATELET
Basophils Absolute: 0 10*3/uL (ref 0.0–0.1)
Basophils Relative: 0.9 % (ref 0.0–3.0)
Eosinophils Absolute: 0.1 10*3/uL (ref 0.0–0.7)
Eosinophils Relative: 2.5 % (ref 0.0–5.0)
HCT: 42.7 % (ref 39.0–52.0)
Hemoglobin: 14.1 g/dL (ref 13.0–17.0)
Lymphocytes Relative: 51.7 % — ABNORMAL HIGH (ref 12.0–46.0)
Lymphs Abs: 2.7 10*3/uL (ref 0.7–4.0)
MCHC: 33 g/dL (ref 30.0–36.0)
MCV: 80.8 fl (ref 78.0–100.0)
Monocytes Absolute: 0.5 10*3/uL (ref 0.1–1.0)
Monocytes Relative: 8.7 % (ref 3.0–12.0)
Neutro Abs: 1.9 10*3/uL (ref 1.4–7.7)
Neutrophils Relative %: 36.2 % — ABNORMAL LOW (ref 43.0–77.0)
Platelets: 225 10*3/uL (ref 150.0–400.0)
RBC: 5.29 Mil/uL (ref 4.22–5.81)
RDW: 19.1 % — ABNORMAL HIGH (ref 11.5–15.5)
WBC: 5.2 10*3/uL (ref 4.0–10.5)

## 2020-10-13 ENCOUNTER — Telehealth: Payer: Self-pay

## 2020-10-13 NOTE — Telephone Encounter (Signed)
MyChart message sent with lab reminder. 

## 2020-10-13 NOTE — Telephone Encounter (Signed)
-----   Message from Yevette Edwards, RN sent at 10/09/2020  1:58 PM EDT ----- Regarding: Labs Repeat labs, order in epic. Labs today or tomorrow 10/14/20

## 2020-10-14 ENCOUNTER — Other Ambulatory Visit: Payer: BC Managed Care – PPO

## 2020-10-14 ENCOUNTER — Other Ambulatory Visit: Payer: Self-pay

## 2020-10-14 DIAGNOSIS — R748 Abnormal levels of other serum enzymes: Secondary | ICD-10-CM | POA: Diagnosis not present

## 2020-10-14 DIAGNOSIS — K5000 Crohn's disease of small intestine without complications: Secondary | ICD-10-CM | POA: Diagnosis not present

## 2020-10-14 LAB — HEPATIC FUNCTION PANEL
ALT: 176 U/L — ABNORMAL HIGH (ref 0–53)
AST: 44 U/L — ABNORMAL HIGH (ref 0–37)
Albumin: 4.3 g/dL (ref 3.5–5.2)
Alkaline Phosphatase: 54 U/L (ref 39–117)
Bilirubin, Direct: 0.2 mg/dL (ref 0.0–0.3)
Total Bilirubin: 1.1 mg/dL (ref 0.2–1.2)
Total Protein: 6.6 g/dL (ref 6.0–8.3)

## 2020-10-18 LAB — IGG: IgG (Immunoglobin G), Serum: 1204 mg/dL (ref 600–1640)

## 2020-10-18 LAB — ANA: Anti Nuclear Antibody (ANA): NEGATIVE

## 2020-10-18 LAB — ANTI-SMOOTH MUSCLE ANTIBODY, IGG: Actin (Smooth Muscle) Antibody (IGG): <20 U (ref <20)

## 2020-10-30 ENCOUNTER — Other Ambulatory Visit (INDEPENDENT_AMBULATORY_CARE_PROVIDER_SITE_OTHER): Payer: BC Managed Care – PPO

## 2020-10-30 DIAGNOSIS — R748 Abnormal levels of other serum enzymes: Secondary | ICD-10-CM | POA: Diagnosis not present

## 2020-10-30 LAB — HEPATIC FUNCTION PANEL
ALT: 78 U/L — ABNORMAL HIGH (ref 0–53)
AST: 34 U/L (ref 0–37)
Albumin: 4.2 g/dL (ref 3.5–5.2)
Alkaline Phosphatase: 53 U/L (ref 39–117)
Bilirubin, Direct: 0.2 mg/dL (ref 0.0–0.3)
Total Bilirubin: 1 mg/dL (ref 0.2–1.2)
Total Protein: 7 g/dL (ref 6.0–8.3)

## 2020-10-31 ENCOUNTER — Other Ambulatory Visit: Payer: Self-pay

## 2020-10-31 DIAGNOSIS — R748 Abnormal levels of other serum enzymes: Secondary | ICD-10-CM

## 2020-10-31 NOTE — Progress Notes (Signed)
LFTs due in December

## 2020-11-04 ENCOUNTER — Encounter: Payer: Self-pay | Admitting: Gastroenterology

## 2020-11-04 ENCOUNTER — Ambulatory Visit: Payer: BC Managed Care – PPO | Admitting: Gastroenterology

## 2020-11-04 VITALS — BP 94/60 | HR 100 | Ht 73.5 in | Wt 196.0 lb

## 2020-11-04 DIAGNOSIS — Z79899 Other long term (current) drug therapy: Secondary | ICD-10-CM

## 2020-11-04 DIAGNOSIS — K50818 Crohn's disease of both small and large intestine with other complication: Secondary | ICD-10-CM | POA: Diagnosis not present

## 2020-11-04 DIAGNOSIS — R748 Abnormal levels of other serum enzymes: Secondary | ICD-10-CM

## 2020-11-04 NOTE — Patient Instructions (Addendum)
If you are age 35 or older, your body mass index should be between 23-30. Your Body mass index is 25.51 kg/m. If this is out of the aforementioned range listed, please consider follow up with your Primary Care Provider.  If you are age 70 or younger, your body mass index should be between 19-25. Your Body mass index is 25.51 kg/m. If this is out of the aformentioned range listed, please consider follow up with your Primary Care Provider.   You will be due for lab work next month. We will remind you when it is time to go to the lab.  Continue Humira.  Please follow up in 6 months. We will remind you when it is time to schedule an appointment.    Thank you for entrusting me with your care and for choosing [facility], Dr. Dargan Cellar

## 2020-11-04 NOTE — Progress Notes (Signed)
HPI :  34 year old male here for follow-up visit for ileal Crohn's disease.  See prior notes for details of his case.  He was diagnosed several years ago, remotely on thiopurines for a period of time but not on therapy for several years.  Was admitted last year with an obstruction at the IC valve. Follow up colonoscopy with me March 2020 showed a distorted IC valve / stenosis at the ileum which could not be traversed. He was referred to Renelda Mom at [facility] who performed ileocecectomy in April 2020. Surgery went quite well and his obstructive symptoms resolved. Path from his operative report showed chronic active inflammation but no pre-cancerous changes.   Historically he has wanted to avoid biologic or medical therapy for his Crohn's disease so we monitored post operatively.  He underwent a colonoscopy with me on July 02 2020.  Findings as outlined below, he had active inflammation at the anastomosis and ileum, Rutgeert's score i2.  Unfortunately through his colon he also had numerous erosions concerning for mild colonic activity.  Biopsies confirmed Crohn's colitis in these areas as well.    At her last visit we discussed options for therapy.  I had recommended Humira plus thiopurine to reduce immunogenicity of Humira.  He tested negative for TB and viral hepatitis and was cleared to start therapy.  Around August time he was started on therapy.  We have been monitoring his labs and his ALT slowly increased.  Initially at 60s-70s, along with mild AST elevation.  I checked thiopurine metabolite levels and 6-TG level was low at 188, and 6 MMP level was elevated at 11,546.  When this was noted his mercaptopurine was stopped.  We monitored his liver enzymes and they have been downtrending.  Markers for autoimmune hepatitis were negative.  ALT peaked at 262, last checked 5 days ago and had downtrending to 78.  He is still taking Humira every 2 weeks.  Has been tolerating this for the past few months.   Generally he feels pretty well without any problems.  He has a slight twinge in his right lower quadrant at times, perhaps once every 10 days which is self-limited.  No blood in his stools, bowels are better.  He has been vaccinated to pneumonia, PCV 13, Covid plus booster, flu shot, all up-to-date.   Colonoscopy 03/01/19 -The perianal and digital rectal examinations were normal. - The IC valve was deformed and scarred down from Crohn's disease. A ? fistulous tract was noted just inferior to the valve. A benign-appearing, intrinsic severe stenosis was found at the ileocecal valve. The lumen was only a few mm wide and could not be traversed. No obvious inflammatory change was noted. Biopsies were taken with a cold forceps for histology. - Polypoid lesions were found in the cecum and at the appendiceal orifice, most consistent with benign pseudoinflammatory polyps. Biopsies were taken with a cold forceps for histology. - Scattered medium-mouthed diverticula were found in the entire colon. - Internal hemorrhoids were found during retroflexion. The hemorrhoids were small. - The exam was otherwise without abnormality. No colonic inflammation   Surgery 04/12/19 - ileocecectomy - Dr. Renelda Mom Path shows chronic active inflammation, with polypoid lesion in the cecum removed - no evidence of atypical lymphoid proliferation  CT scan 06/26/19 - s/p ileocecectomy, multiple benign appearing lymph nodes in RLQ, benign small pulmonary nodule  Colonoscopy 07/02/20 - The perianal and digital rectal examinations were normal. - There was evidence of a prior end-to-side ileo-colonic anastomosis in  the ascending colon. This was patent and was characterized by a few apthous ulcerations but was widely patent. - Patchy inflammation, graded as Rutgeerts Score i2 (more than five aphthous lesions with normal intervening mucosa or skip areas of larger lesions or lesions confined to the ileocolonic anastomosis)  and characterized by erosions and aphthous ulcerations was found in the terminal ileum. - A few isolated erosions were found in the descending colon, in the transverse colon and in the ascending colon. Biopsies were taken with a cold forceps for histology. - Scattered small-mouthed diverticula were found in the transverse colon and left colon. - Internal hemorrhoids were found during retroflexion. - The exam was otherwise without abnormality.  Diagnosis Surgical [P], colon - CHRONIC ACTIVE COLITIS - NO GRANULOMATA, DYSPLASIA OR MALIGNANCY IDENTIFIED - SEE COMMENT  Labs 02/01/19 - TB negative, TPMT 20.1, hep B testing negative 2020     Past Medical History:  Diagnosis Date  . Chest pain   . Contusion of rib on left side   . Corneal abrasion    non-healing, wore a permanent contact and finally removed yesterday  . Crohn's disease ([place])    ileal disease s/p ileocecectomy   . RUQ pain   . Sinusitis      Past Surgical History:  Procedure Laterality Date  . EYE SURGERY    . KNEE SURGERY     Family History  Problem Relation Age of Onset  . Crohn's disease Father   . Other Other        HEALTHY  . Other Other        HEALTHY  . Other Other        HEALTHY  . Colon cancer Neg Hx   . Esophageal cancer Neg Hx    Social History   Tobacco Use  . Smoking status: Former Smoker    Packs/day: 1.00    Years: 12.00    Pack years: 12.00  . Smokeless tobacco: Never Used  Vaping Use  . Vaping Use: Former  . Devices: trying to quit   Substance Use Topics  . Alcohol use: Yes    Comment: h/o heavy use  . Drug use: No   Current Outpatient Medications  Medication Sig Dispense Refill  . Adalimumab (HUMIRA PEN) 40 MG/0.4ML PNKT Inject 40 mg into the skin every 14 (fourteen) days. After the completion of the starter kit 2 each 6  . carboxymethylcellulose (REFRESH PLUS) 0.5 % SOLN Place 1 drop into the left eye 3 (three) times daily as needed.    . finasteride (PROPECIA) 1 MG  tablet Take 1 mg by mouth daily. Hair loss    . Multiple Vitamins-Minerals (MULTIVITAMIN WITH MINERALS) tablet Take 1 tablet by mouth daily.    . sodium chloride (MURO 128) 5 % ophthalmic ointment Place 1 application into the left eye at bedtime.    . sodium chloride (MURO 128) 5 % ophthalmic solution Place 1 drop into the left eye 3 (three) times daily.    Marland Kitchen DOXYCYCLINE PO Take 100 mg elemental calcium/kg/hr by mouth daily.  (Patient not taking: Reported on 11/04/2020)     No current facility-administered medications for this visit.   Allergies  Allergen Reactions  . Milk-Related Compounds      Review of Systems: All systems reviewed and negative except where noted in HPI.   Lab Results  Component Value Date   WBC 5.2 10/09/2020   HGB 14.1 10/09/2020   HCT 42.7 10/09/2020   MCV 80.8 10/09/2020  PLT 225.0 10/09/2020    Lab Results  Component Value Date   CREATININE 0.99 02/01/2019   BUN 12 02/01/2019   NA 139 02/01/2019   K 4.0 02/01/2019   CL 106 02/01/2019   CO2 25 02/01/2019    Lab Results  Component Value Date   ALT 78 (H) 10/30/2020   AST 34 10/30/2020   ALKPHOS 53 10/30/2020   BILITOT 1.0 10/30/2020     Physical Exam: BP 94/60 (BP Location: Left Arm, Patient Position: Sitting, Cuff Size: Normal)   Pulse 100   Ht 6' 1.5" (1.867 m) Comment: height measured without shoes  Wt 196 lb (88.9 kg)   BMI 25.51 kg/m  Constitutional: Pleasant,well-developed, male in no acute distress. Abdominal: Soft, nondistended, nontender. . There are no masses palpable.  Extremities: no edema Lymphadenopathy: No cervical adenopathy noted. Neurological: Alert and oriented to person place and time. Skin: Skin is warm and dry. No rashes noted. Psychiatric: Normal mood and affect. Behavior is normal.   ASSESSMENT AND PLAN: 34 year old male here for reassessment the following:  Ileocolonic Crohn's disease  High risk medication use Elevated liver enzymes  As above, status  post surgery for IC valve stricture, had recurrence of disease on colonoscopy and started Humira plus 6-MP this past summer.  While his TPMT enzyme assay was normal, he unfortunately developed hepatotoxicity with a markedly elevated 6 MMP level in recent months.  Unfortunately we had to stop the 6-MP, his liver enzymes are downtrending, he has continued Humira.  I discussed the situation with him.  I would not use thiopurines for him in the future, unless we used an extremely low dose with allopurinol to shunt towards 6-TG pathway.  For now we will continue with Humira monotherapy, he is tolerating it well.  We will need to observe over time to make sure he does not develop immunogenicity to Humira.  He been on this a few months now, will check a fecal calprotectin to make sure stable next month, we will also repeat LFTs in 1 month as well as CBC.  He will need PCV 23 vaccine in the next year, we will plan on doing that at his follow-up in about 6 months.  If he has any concerns in the interim he should let me know.  Otherwise discussed risk and benefits of long-term anti-TNF therapy, he verbalized understanding we will keep an eye out in case he develops any concerning symptoms. All questions answered he agreed with the plan.  [place] Cellar, MD [facility]

## 2020-11-17 ENCOUNTER — Encounter: Payer: Self-pay | Admitting: Gastroenterology

## 2020-11-17 ENCOUNTER — Ambulatory Visit (INDEPENDENT_AMBULATORY_CARE_PROVIDER_SITE_OTHER): Payer: BC Managed Care – PPO | Admitting: Gastroenterology

## 2020-11-17 VITALS — BP 100/70 | HR 84 | Ht 73.5 in | Wt 195.4 lb

## 2020-11-17 DIAGNOSIS — K50819 Crohn's disease of both small and large intestine with unspecified complications: Secondary | ICD-10-CM | POA: Diagnosis not present

## 2020-11-17 DIAGNOSIS — R21 Rash and other nonspecific skin eruption: Secondary | ICD-10-CM

## 2020-11-17 DIAGNOSIS — Z79899 Other long term (current) drug therapy: Secondary | ICD-10-CM | POA: Diagnosis not present

## 2020-11-17 NOTE — Patient Instructions (Addendum)
If you are age 35 or older, your body mass index should be between 23-30. Your Body mass index is 25.43 kg/m. If this is out of the aforementioned range listed, please consider follow up with your Primary Care Provider.  If you are age 20 or younger, your body mass index should be between 19-25. Your Body mass index is 25.43 kg/m. If this is out of the aformentioned range listed, please consider follow up with your Primary Care Provider.     Thank you for entrusting me with your care and for choosing [facility], Dr. [place] Cellar

## 2020-11-17 NOTE — Progress Notes (Signed)
HPI :  34 year old male here for follow-up visit for ileal Crohn's disease.  See prior notes for details of his case.  He was diagnosed several years ago, remotely on thiopurines for a period of time but not on therapy for several years.  Was admitted last year with an obstruction at the IC valve. Follow up colonoscopy with me March 2020 showed a distorted IC valve / stenosis at the ileum which could not be traversed. He was referred to Renelda Mom at [facility] who performed ileocecectomy in April 2020.Surgery went quite well and his obstructive symptoms resolved. Path from his operative report showed chronic active inflammation but no pre-cancerous changes.   Historically he has wanted to avoid biologic or medical therapy for his Crohn's diseaseso we monitored post operatively. He underwent a colonoscopy with me on July 02 2020. Findings as outlined below, he had active inflammation at the anastomosis and ileum, Rutgeert's score i2.Unfortunately through his colon he also had numerous erosions concerning for mild colonic activity. Biopsies confirmed Crohn's colitis in these areas as well.   At his last visit we discussed options for therapy.  I had recommended Humira plus thiopurine to reduce immunogenicity of Humira.  He tested negative for TB and viral hepatitis and was cleared to start therapy.  Around August time he was started on therapy.  We have been monitoring his labs and his ALT slowly increased.  Initially at 60s-70s, along with mild AST elevation.  I checked thiopurine metabolite levels and 6-TG level was low at 188, and 6 MMP level was elevated at 11,546.  When this was noted his mercaptopurine was stopped.  We monitored his liver enzymes and they have been downtrending.  Markers for autoimmune hepatitis were negative.  ALT peaked at 262, last checked 5 days ago and had downtrending to 78.  Since his last visit his liver enzymes have considerably improved, ALT down trended to  70s, AST normal, has plans for repeat LFTs next month.  He has been taking Humira every 2 weeks.  He is here for an acute visit today regarding itching and rash.  He states on Thanksgiving day he developed diffuse itching all over his entire body, and his scalp, and his hands and feet.  When he would scratch he would develop small red welt and then resolved.  Symptoms had persisted over the weekend, I messaged him yesterday when he reached out to our office to try some Benadryl.  He took 50 mg of Benadryl last night, states symptoms have for the most part resolved and he is feeling much better today.  He had some dandruff that have been new for him during this course, denies any plaque-like lesions in his scalp.  Denies any blistering or plaque lesions on his palms or soles.  He has no obvious skin lesions on exam today.  He inquires if this is a reaction to his Humira or not.  He denies any shortness of breath or wheezing during this time.    He has been vaccinated to pneumonia, PCV 13, Covid plus booster, flu shot, all up-to-date.   Colonoscopy 03/01/19 -The perianal and digital rectal examinations were normal. - The IC valve was deformed and scarred down from Crohn's disease. A ? fistulous tract was noted just inferior to the valve. A benign-appearing, intrinsic severe stenosis was found at the ileocecal valve. The lumen was only a few mm wide and could not be traversed. No obvious inflammatory change was noted. Biopsies were taken with  a cold forceps for histology. - Polypoid lesions were found in the cecum and at the appendiceal orifice, most consistent with benign pseudoinflammatory polyps. Biopsies were taken with a cold forceps for histology. - Scattered medium-mouthed diverticula were found in the entire colon. - Internal hemorrhoids were found during retroflexion. The hemorrhoids were small. - The exam was otherwise without abnormality. No colonic inflammation   Surgery 04/12/19 -  ileocecectomy - Dr. Renelda Mom Path shows chronic active inflammation, with polypoid lesion in the cecum removed - no evidence of atypical lymphoid proliferation  CT scan 06/26/19 - s/p ileocecectomy, multiple benign appearing lymph nodes in RLQ, benign small pulmonary nodule  Colonoscopy 07/02/20 -The perianal and digital rectal examinations were normal. - There was evidence of a prior end-to-side ileo-colonic anastomosis in the ascending colon. This was patent and was characterized by a few apthous ulcerations but was widely patent. - Patchy inflammation, graded as Rutgeerts Score i2 (more than five aphthous lesions with normal intervening mucosa or skip areas of larger lesions or lesions confined to the ileocolonic anastomosis) and characterized by erosions and aphthous ulcerations was found in the terminal ileum. - A few isolated erosions were found in the descending colon, in the transverse colon and in the ascending colon. Biopsies were taken with a cold forceps for histology. - Scattered small-mouthed diverticula were found in the transverse colon and left colon. - Internal hemorrhoids were found during retroflexion. - The exam was otherwise without abnormality.  Diagnosis Surgical [P], colon - CHRONIC ACTIVE COLITIS - NO GRANULOMATA, DYSPLASIA OR MALIGNANCY IDENTIFIED - SEE COMMENT  Labs 02/01/19 - TB negative, TPMT 20.1, hep B testing negative 2020   Past Medical History:  Diagnosis Date  . Chest pain   . Contusion of rib on left side   . Corneal abrasion    non-healing, wore a permanent contact and finally removed yesterday  . Crohn's disease ([place])    ileal disease s/p ileocecectomy   . RUQ pain   . Sinusitis      Past Surgical History:  Procedure Laterality Date  . EYE SURGERY    . KNEE SURGERY     Family History  Problem Relation Age of Onset  . Crohn's disease Father   . Other Other        HEALTHY  . Other Other        HEALTHY  . Other Other         HEALTHY  . Colon cancer Neg Hx   . Esophageal cancer Neg Hx    Social History   Tobacco Use  . Smoking status: Former Smoker    Packs/day: 1.00    Years: 12.00    Pack years: 12.00  . Smokeless tobacco: Never Used  Vaping Use  . Vaping Use: Former  . Devices: trying to quit   Substance Use Topics  . Alcohol use: Yes    Comment: h/o heavy use  . Drug use: No   Current Outpatient Medications  Medication Sig Dispense Refill  . Adalimumab (HUMIRA PEN) 40 MG/0.4ML PNKT Inject 40 mg into the skin every 14 (fourteen) days. After the completion of the starter kit 2 each 6  . carboxymethylcellulose (REFRESH PLUS) 0.5 % SOLN Place 1 drop into the left eye 3 (three) times daily as needed.    . finasteride (PROPECIA) 1 MG tablet Take 1 mg by mouth daily. Hair loss    . Multiple Vitamins-Minerals (MULTIVITAMIN WITH MINERALS) tablet Take 1 tablet by mouth daily.    Marland Kitchen  sodium chloride (MURO 128) 5 % ophthalmic ointment Place 1 application into the left eye at bedtime.    Marland Kitchen DOXYCYCLINE PO Take 100 mg elemental calcium/kg/hr by mouth daily.  (Patient not taking: Reported on 11/17/2020)     No current facility-administered medications for this visit.   Allergies  Allergen Reactions  . Milk-Related Compounds      Review of Systems: All systems reviewed and negative except where noted in HPI.   Lab Results  Component Value Date   WBC 5.2 10/09/2020   HGB 14.1 10/09/2020   HCT 42.7 10/09/2020   MCV 80.8 10/09/2020   PLT 225.0 10/09/2020    Lab Results  Component Value Date   CREATININE 0.99 02/01/2019   BUN 12 02/01/2019   NA 139 02/01/2019   K 4.0 02/01/2019   CL 106 02/01/2019   CO2 25 02/01/2019    Lab Results  Component Value Date   ALT 78 (H) 10/30/2020   AST 34 10/30/2020   ALKPHOS 53 10/30/2020   BILITOT 1.0 10/30/2020     Physical Exam: BP 100/70 (BP Location: Left Arm, Patient Position: Sitting, Cuff Size: Normal)   Pulse 84   Ht 6' 1.5" (1.867 m)   Wt 195  lb 6 oz (88.6 kg)   BMI 25.43 kg/m  Constitutional: Pleasant,well-developed, male in no acute distress. HEENT: Normocephalic and atraumatic. Conjunctivae are normal. No scleral icterus. No obvious scalp lesion Neurological: Alert and oriented to person place and time. Skin: Skin is warm and dry. No rashes noted that are obvious Psychiatric: Normal mood and affect. Behavior is normal.   ASSESSMENT AND PLAN: 34 year old male here for reassessment the following:  Skin rash /pruritus Crohn's disease High risk medication use  As above, now off thiopurine's and his liver enzymes have significantly improved.  He is due to have these checked again next month to make sure they have normalized.  In the interim has maintained on Humira every other week.  As above he had significant diffuse body pruritus with some red welts with scratching himself since Thanksgiving.  Took some Benadryl recently and since then has been doing much better.  Really hard to say if this is a side effect of Humira or not at this time.  His skin exam is pretty normal today.  I do not see any obvious lesions in his scalp or hands feet, otherwise no concerning lesions for psoriasis at this time.  I discussed that skin rashes, particularly psoriasis, and other skin conditions can be a result of anti-TNF therapy, only time will tell if this is a persistent issue for him if it is related to the drug or not.  He was last due for his Humira last Sunday, he will hold it this week and use Benadryl as needed in the interim if he has any recurrence of symptoms.  If he continues to feel well without any recurrent symptoms I think okay to take his dose of Humira next weekend and monitor.  He may want to take this with Benadryl and would have low threshold to take additional Benadryl if needed.  He had no shortness of breath or concerning reaction to this otherwise previously.  I asked him to contact me next week and let me know how he is doing.   If for some reason this recurs I will refer him to dermatology.  He agreed  [place] Cellar, MD [facility]

## 2020-11-25 ENCOUNTER — Telehealth: Payer: Self-pay

## 2020-11-25 NOTE — Telephone Encounter (Signed)
Called and left message for pt to go to the lab at the end of next week for Dr. Havery Moros.

## 2020-11-25 NOTE — Telephone Encounter (Signed)
-----   Message from Roetta Sessions, [place] sent at 10/31/2020  8:12 AM EST ----- Regarding: LFTs due LFTs due

## 2020-12-02 ENCOUNTER — Encounter: Payer: Self-pay | Admitting: Gastroenterology

## 2020-12-02 ENCOUNTER — Telehealth: Payer: Self-pay

## 2020-12-02 ENCOUNTER — Ambulatory Visit: Payer: BC Managed Care – PPO | Admitting: Gastroenterology

## 2020-12-02 VITALS — BP 120/78 | HR 82 | Ht 74.0 in | Wt 193.0 lb

## 2020-12-02 DIAGNOSIS — R21 Rash and other nonspecific skin eruption: Secondary | ICD-10-CM

## 2020-12-02 DIAGNOSIS — K50819 Crohn's disease of both small and large intestine with unspecified complications: Secondary | ICD-10-CM | POA: Diagnosis not present

## 2020-12-02 DIAGNOSIS — Z79899 Other long term (current) drug therapy: Secondary | ICD-10-CM | POA: Diagnosis not present

## 2020-12-02 NOTE — Telephone Encounter (Signed)
Wonderful, thank so much for the follow up, glad he could get in so quickly.

## 2020-12-02 NOTE — Telephone Encounter (Signed)
Patient has appt with [facility] (([id number]) next Wednesday, 12-09-20 at 8:45am with Dr. Pearline Cables. They are located at [address] [place], [place] [address]. Faxed referral to [phone].  Called and left detailed message for pt at [phone]

## 2020-12-02 NOTE — Patient Instructions (Addendum)
If you are age 35 or older, your body mass index should be between 23-30. Your Body mass index is 24.78 kg/m. If this is out of the aforementioned range listed, please consider follow up with your Primary Care Provider.  If you are age 90 or younger, your body mass index should be between 19-25. Your Body mass index is 24.78 kg/m. If this is out of the aformentioned range listed, please consider follow up with your Primary Care Provider.   Resume Benadryl.  We will refer you to Dermatology.  They will contact you to schedule an appointment.  Thank you for entrusting me with your care and for choosing [facility], Dr. [place] Cellar

## 2020-12-02 NOTE — Progress Notes (Signed)
HPI :  34 year old male here for follow-up visit for rash. He has a history of ileal Crohn's disease. See prior notes for details of his case. He was diagnosed several years ago, remotely on thiopurines for a period of time but not on therapy for several years. Was admitted last year with an obstruction at the IC valve. Follow up colonoscopy with me March 2020 showed a distorted IC valve / stenosis at the ileum which could not be traversed. He was referred to Renelda Mom at [facility] who performed ileocecectomy in April 2020.Surgery went quite well and his obstructive symptoms resolved. Path from his operative report showed chronic active inflammation but no pre-cancerous changes.   Historically he has wanted to avoid biologic or medical therapy for his Crohn's diseaseso we monitored post operatively.He underwent a colonoscopy with me on July 152021. Findings as outlined below, he had active inflammation at the anastomosis and ileum, Rutgeert's score i2.Unfortunately through his colon he also had numerous erosions concerning for mild colonic activity. Biopsies confirmed Crohn's colitis in these areas as well.  Previously we discussed options for therapy. I had recommended Humira plus thiopurine to reduce immunogenicity of Humira. He tested negative for TB and viral hepatitis and was cleared to start therapy. Around August time he was started on therapy. We have been monitoring his labs and his ALT slowly increased. Initially at 60s-70s,along with mild AST elevation. I checked thiopurine metabolite levels and 6-TG level was low at 188, and 6 MMP level was elevated at 11,546. When this was noted his mercaptopurine was stopped. We monitored his liver enzymes and they have been downtrending. Markers for autoimmune hepatitis were negative. ALT peaked at 262, last checked 5 days ago and had downtrending to 78.  Since his last visit his liver enzymes have considerably improved, ALT  down trended to 70s, AST normal, has plans for repeat LFTs next month.  He has been taking Humira every 2 weeks.  He is specifically here today regarding a rash that has persisted.  He saw me a few weeks ago for this.  It started around Thanksgiving.  He states it initially starts with itching without any rash, he will then scratch it, he will then develop small welts/hives wherever he scratches.  We had recommended some Benadryl initially.  He typically takes Benadryl 25 mg a day and this has been controlling it for the most part he feels okay.  However if he stops the Benadryl the rash will come back.  He stopped it a few days ago in preparation for this visit and he has excoriations and welts on his arms, legs and torso.  Denies any blistering or plaque lesions.  No wheezing or shortness of breath.  He started his Humira back in August.  His Crohn's symptoms otherwise feel well controlled and he is not not having any other complaints today.  He has been vaccinated to pneumonia, PCV 13, Covid plus booster, flu shot, all up-to-date.   Colonoscopy 03/01/19 -The perianal and digital rectal examinations were normal. - The IC valve was deformed and scarred down from Crohn's disease. A ? fistulous tract was noted just inferior to the valve. A benign-appearing, intrinsic severe stenosis was found at the ileocecal valve. The lumen was only a few mm wide and could not be traversed. No obvious inflammatory change was noted. Biopsies were taken with a cold forceps for histology. - Polypoid lesions were found in the cecum and at the appendiceal orifice, most consistent with benign  pseudoinflammatory polyps. Biopsies were taken with a cold forceps for histology. - Scattered medium-mouthed diverticula were found in the entire colon. - Internal hemorrhoids were found during retroflexion. The hemorrhoids were small. - The exam was otherwise without abnormality. No colonic inflammation  Surgery 04/12/19 -  ileocecectomy - Dr. Renelda Mom Path shows chronic active inflammation, with polypoid lesion in the cecum removed - no evidence of atypical lymphoid proliferation  CT scan 06/26/19 - s/p ileocecectomy, multiple benign appearing lymph nodes in RLQ, benign small pulmonary nodule  Colonoscopy 07/02/20 -The perianal and digital rectal examinations were normal. - There was evidence of a prior end-to-side ileo-colonic anastomosis in the ascending colon. This was patent and was characterized by a few apthous ulcerations but was widely patent. - Patchy inflammation, graded as Rutgeerts Score i2 (more than five aphthous lesions with normal intervening mucosa or skip areas of larger lesions or lesions confined to the ileocolonic anastomosis) and characterized by erosions and aphthous ulcerations was found in the terminal ileum. - A few isolated erosions were found in the descending colon, in the transverse colon and in the ascending colon. Biopsies were taken with a cold forceps for histology. - Scattered small-mouthed diverticula were found in the transverse colon and left colon. - Internal hemorrhoids were found during retroflexion. - The exam was otherwise without abnormality.  Diagnosis Surgical [P], colon - CHRONIC ACTIVE COLITIS - NO GRANULOMATA, DYSPLASIA OR MALIGNANCY IDENTIFIED - SEE COMMENT  Labs 02/01/19 - TB negative, TPMT 20.1, hep B testing negative 2020   Past Medical History:  Diagnosis Date  . Chest pain   . Contusion of rib on left side   . Corneal abrasion    non-healing, wore a permanent contact and finally removed yesterday  . Crohn's disease ([place])    ileal disease s/p ileocecectomy   . RUQ pain   . Sinusitis      Past Surgical History:  Procedure Laterality Date  . EYE SURGERY    . KNEE SURGERY     Family History  Problem Relation Age of Onset  . Crohn's disease Father   . Other Other        HEALTHY  . Other Other        HEALTHY  . Other Other         HEALTHY  . Colon cancer Neg Hx   . Esophageal cancer Neg Hx    Social History   Tobacco Use  . Smoking status: Former Smoker    Packs/day: 1.00    Years: 12.00    Pack years: 12.00  . Smokeless tobacco: Never Used  Vaping Use  . Vaping Use: Former  . Devices: trying to quit   Substance Use Topics  . Alcohol use: Yes    Comment: h/o heavy use  . Drug use: No   Current Outpatient Medications  Medication Sig Dispense Refill  . Adalimumab (HUMIRA PEN) 40 MG/0.4ML PNKT Inject 40 mg into the skin every 14 (fourteen) days. After the completion of the starter kit 2 each 6  . carboxymethylcellulose (REFRESH PLUS) 0.5 % SOLN Place 1 drop into the left eye 3 (three) times daily as needed.    Marland Kitchen DOXYCYCLINE PO Take 100 mg elemental calcium/kg/hr by mouth daily.  (Patient not taking: Reported on 11/17/2020)    . finasteride (PROPECIA) 1 MG tablet Take 1 mg by mouth daily. Hair loss    . Multiple Vitamins-Minerals (MULTIVITAMIN WITH MINERALS) tablet Take 1 tablet by mouth daily.    . sodium  chloride (MURO 128) 5 % ophthalmic ointment Place 1 application into the left eye at bedtime.     No current facility-administered medications for this visit.   Allergies  Allergen Reactions  . Milk-Related Compounds      Review of Systems: All systems reviewed and negative except where noted in HPI.   Lab Results  Component Value Date   WBC 5.2 10/09/2020   HGB 14.1 10/09/2020   HCT 42.7 10/09/2020   MCV 80.8 10/09/2020   PLT 225.0 10/09/2020    Lab Results  Component Value Date   CREATININE 0.99 02/01/2019   BUN 12 02/01/2019   NA 139 02/01/2019   K 4.0 02/01/2019   CL 106 02/01/2019   CO2 25 02/01/2019    Lab Results  Component Value Date   ALT 78 (H) 10/30/2020   AST 34 10/30/2020   ALKPHOS 53 10/30/2020   BILITOT 1.0 10/30/2020     Physical Exam: BP 120/78   Pulse 82   Ht _0  (1.88 m)   Wt 193 lb (87.5 kg)   BMI 24.78 kg/m  Constitutional:  Pleasant,well-developed, male in no acute distress. Lymphadenopathy: No cervical adenopathy noted. Neurological: Alert and oriented to person place and time. Skin: Skin is warm and dry. Areas on arms, legs, torso - erythematous small hives / welts at areas where he scratched. No dermatitis or obvious skin breakdown Psychiatric: Normal mood and affect. Behavior is normal.   ASSESSMENT AND PLAN: 34 year old male here for reassessment the following:  Skin rash / hives Crohn's disease High risk medication use  It appears he has developed what sounds like pathergy.  Initially has some pruritus and when he scratches he develops red raised small welts.  Benadryl has controlled this and allows him to function however it has persisted over time.  Humira started in August, this just started in November, unclear if any relationship there.  I do not see any obvious psoriasis or other dermatitis.  He is now off thiopurine's entirely.  At this point in time he can resume Benadryl 25 mg once daily as that appears to be controlling this.  Also recommend topical moisturizer daily.  Ultimately recommend referral to dermatology to help determine if they think this is related to Humira or not, counseled him he may need to see an allergist as well pending his course.  He is in agreement with the plan.  We will monitor over time on Benadryl and if for some reason this worsens or he develops changes in this rash in the interim he will let me know.  We will otherwise plan on continuing his Humira for now.  He is due for some basic labs this month as well as fecal calprotectin, he will go to the lab at his convenience.  We will make sure his liver enzymes have normalized in interim as well.  He agreed  Success Cellar, MD [facility]

## 2020-12-08 ENCOUNTER — Other Ambulatory Visit (INDEPENDENT_AMBULATORY_CARE_PROVIDER_SITE_OTHER): Payer: BC Managed Care – PPO

## 2020-12-08 DIAGNOSIS — R748 Abnormal levels of other serum enzymes: Secondary | ICD-10-CM

## 2020-12-08 DIAGNOSIS — K50818 Crohn's disease of both small and large intestine with other complication: Secondary | ICD-10-CM | POA: Diagnosis not present

## 2020-12-08 DIAGNOSIS — Z79899 Other long term (current) drug therapy: Secondary | ICD-10-CM | POA: Diagnosis not present

## 2020-12-08 LAB — HEPATIC FUNCTION PANEL
ALT: 33 U/L (ref 0–53)
AST: 23 U/L (ref 0–37)
Albumin: 4.1 g/dL (ref 3.5–5.2)
Alkaline Phosphatase: 56 U/L (ref 39–117)
Bilirubin, Direct: 0.1 mg/dL (ref 0.0–0.3)
Total Bilirubin: 0.6 mg/dL (ref 0.2–1.2)
Total Protein: 6.9 g/dL (ref 6.0–8.3)

## 2020-12-08 LAB — CBC WITH DIFFERENTIAL/PLATELET
Basophils Absolute: 0 10*3/uL (ref 0.0–0.1)
Basophils Relative: 0.6 % (ref 0.0–3.0)
Eosinophils Absolute: 0.3 10*3/uL (ref 0.0–0.7)
Eosinophils Relative: 4.1 % (ref 0.0–5.0)
HCT: 41.4 % (ref 39.0–52.0)
Hemoglobin: 13.7 g/dL (ref 13.0–17.0)
Lymphocytes Relative: 43.8 % (ref 12.0–46.0)
Lymphs Abs: 3 10*3/uL (ref 0.7–4.0)
MCHC: 33.2 g/dL (ref 30.0–36.0)
MCV: 82.2 fl (ref 78.0–100.0)
Monocytes Absolute: 0.5 10*3/uL (ref 0.1–1.0)
Monocytes Relative: 7.4 % (ref 3.0–12.0)
Neutro Abs: 3 10*3/uL (ref 1.4–7.7)
Neutrophils Relative %: 44.1 % (ref 43.0–77.0)
Platelets: 184 10*3/uL (ref 150.0–400.0)
RBC: 5.04 Mil/uL (ref 4.22–5.81)
RDW: 14.1 % (ref 11.5–15.5)
WBC: 6.8 10*3/uL (ref 4.0–10.5)

## 2020-12-08 LAB — COMPREHENSIVE METABOLIC PANEL WITH GFR
ALT: 33 U/L (ref 0–53)
AST: 23 U/L (ref 0–37)
Albumin: 4.1 g/dL (ref 3.5–5.2)
Alkaline Phosphatase: 56 U/L (ref 39–117)
BUN: 18 mg/dL (ref 6–23)
CO2: 30 meq/L (ref 19–32)
Calcium: 9.1 mg/dL (ref 8.4–10.5)
Chloride: 104 meq/L (ref 96–112)
Creatinine, Ser: 1.13 mg/dL (ref 0.40–1.50)
GFR: 84.13 mL/min (ref >60.00)
Glucose, Bld: 92 mg/dL (ref 70–99)
Potassium: 3.8 meq/L (ref 3.5–5.1)
Sodium: 139 meq/L (ref 135–145)
Total Bilirubin: 0.6 mg/dL (ref 0.2–1.2)
Total Protein: 6.9 g/dL (ref 6.0–8.3)

## 2020-12-09 DIAGNOSIS — L509 Urticaria, unspecified: Secondary | ICD-10-CM | POA: Diagnosis not present

## 2020-12-09 DIAGNOSIS — L298 Other pruritus: Secondary | ICD-10-CM | POA: Diagnosis not present

## 2020-12-15 ENCOUNTER — Other Ambulatory Visit: Payer: BC Managed Care – PPO

## 2020-12-15 DIAGNOSIS — Z79899 Other long term (current) drug therapy: Secondary | ICD-10-CM

## 2020-12-15 DIAGNOSIS — K50818 Crohn's disease of both small and large intestine with other complication: Secondary | ICD-10-CM | POA: Diagnosis not present

## 2020-12-15 DIAGNOSIS — R748 Abnormal levels of other serum enzymes: Secondary | ICD-10-CM | POA: Diagnosis not present

## 2020-12-16 ENCOUNTER — Other Ambulatory Visit: Payer: Self-pay

## 2020-12-16 DIAGNOSIS — R21 Rash and other nonspecific skin eruption: Secondary | ICD-10-CM

## 2020-12-16 DIAGNOSIS — L509 Urticaria, unspecified: Secondary | ICD-10-CM

## 2020-12-17 LAB — CALPROTECTIN, FECAL: Calprotectin, Fecal: 23 ug/g (ref 0–120)

## 2021-01-24 DIAGNOSIS — Z79899 Other long term (current) drug therapy: Secondary | ICD-10-CM

## 2021-01-24 DIAGNOSIS — R197 Diarrhea, unspecified: Secondary | ICD-10-CM

## 2021-01-25 DIAGNOSIS — Z0182 Encounter for allergy testing: Secondary | ICD-10-CM | POA: Diagnosis not present

## 2021-01-28 DIAGNOSIS — L233 Allergic contact dermatitis due to drugs in contact with skin: Secondary | ICD-10-CM | POA: Diagnosis not present

## 2021-02-02 ENCOUNTER — Encounter: Payer: Self-pay | Admitting: Allergy & Immunology

## 2021-02-02 ENCOUNTER — Ambulatory Visit: Payer: BC Managed Care – PPO | Admitting: Allergy & Immunology

## 2021-02-02 ENCOUNTER — Other Ambulatory Visit: Payer: Self-pay

## 2021-02-02 VITALS — BP 120/78 | HR 72 | Ht 74.0 in | Wt 191.8 lb

## 2021-02-02 DIAGNOSIS — L508 Other urticaria: Secondary | ICD-10-CM | POA: Diagnosis not present

## 2021-02-02 DIAGNOSIS — J310 Chronic rhinitis: Secondary | ICD-10-CM | POA: Diagnosis not present

## 2021-02-02 NOTE — Progress Notes (Signed)
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  It was fairly short in duration and James Ortiz has not had any issues since December.  Testing today is not revealing at all, although James Ortiz is slightly reactive to 1 week.  I know James Ortiz is concerned that this is related to his Humira, but the TNF inhibitors are associated with a 5% to 6% incidence of a psoriasiform rash rather than urticarial.  Therefore, I would not change his Humira since it is controlling his Crohn's disease so well.  Plan/Recommendations:   1. Acute urticaria - Testing today was essentially negative to the entire panel.  - You were slightly reactive to one weed, otherwise nothing came up. - This included the most common foods. - Copy of testing results provided. - We can do a more thorough workup in the future if the hives return. - But right now, I think we are in a good place and do not need to pursue that.  2. Return if symptoms worsen or fail to improve.  Subjective:   James Ortiz is a 35 y.o. male presenting today for evaluation of  Chief Complaint  Patient presents with  . Rash  . Urticaria    James Ortiz has a history of the following: Patient Active Problem List   Diagnosis Date Noted  . Inflammatory bowel disease 01/31/2019  . Hyperglycemia 01/31/2019  . Crohn's disease of small intestine with other complication ([place])   . CONTUSION, LEFT RIB 09/23/2010  . CHEST PAIN, RIGHT 07/19/2010  . RUQ PAIN 07/19/2010    History obtained from: chart review and patient.  James Ortiz was referred by Patient, No Pcp Per.     James Ortiz is a 35 y.o. male presenting for an  evaluation of urticaria.  James Ortiz tells me that James Ortiz started itching in November.  James Ortiz developed hives, confirmed with pictures from his phone today.  They went all over his body.  The coming.  James Ortiz reports pruritus over his back as well as the rest of his body.  James Ortiz also describes what sounds like dermatographia.  In general, James Ortiz was itchy for 2 weeks.  James Ortiz ended up going to see a dermatologist at [facility].  James Ortiz underwent patch testing that was positive to balsam of [place].  James Ortiz was given a steroid shot and it cleared fairly quickly after that.  By December, it was cleared completely.  James Ortiz denies any new exposures.  James Ortiz continues to eat just about everything.  James Ortiz does not get was related to food at all, but James Ortiz would like to be sure.  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  His dog is with him in the office.  James Ortiz was burning some candles in there as well because the dog stinks.  James Ortiz has some plants in the office as well.  James Ortiz otherwise denies any new exposures.  James Ortiz does have a history of Crohn's disease which she has had for about 15 years.  James Ortiz was recently started on Humira for this in August 2021.  It is controlled his symptoms very well.  James Ortiz  called his gastroenterologist when the hives started since that James Ortiz was concerned that it might be related to that.  This is why James Ortiz was referred here.  Last time James Ortiz had a hive was in December.  James Ortiz was getting canceled the appointment but thought since it was already scheduled James Ortiz might as well go through with it.  James Ortiz does have environmental allergies and received allergy shots for approximately 7 years through [facility].  James Ortiz has not really used any medications consistently since then.  Otherwise, there is no history of other atopic diseases, including asthma, food allergies, drug allergies, stinging insect allergies, eczema or contact dermatitis. There is no significant infectious history. Vaccinations are up to date.    Past Medical  History: Patient Active Problem List   Diagnosis Date Noted  . Inflammatory bowel disease 01/31/2019  . Hyperglycemia 01/31/2019  . Crohn's disease of small intestine with other complication ([place])   . CONTUSION, LEFT RIB 09/23/2010  . CHEST PAIN, RIGHT 07/19/2010  . RUQ PAIN 07/19/2010    Medication List:  Allergies as of 02/02/2021      Reactions   Milk-related Compounds    Other Other (See Comments)   Lactose intolerant but able to consume cheese. Avoids milk, ice cream, pudding. MM, RD 04/15/19 Lactose intolerant but able to consume cheese. Avoids milk, ice cream, pudding. MM, RD 04/15/19      Medication List       Accurate as of February 02, 2021  9:57 AM. If you have any questions, ask your nurse or doctor.        carboxymethylcellulose 0.5 % Soln Commonly known as: REFRESH PLUS Place 1 drop into the left eye 3 (three) times daily as needed.   DOXYCYCLINE PO Take 100 mg elemental calcium/kg/hr by mouth daily.   finasteride 1 MG tablet Commonly known as: PROPECIA Take 1 mg by mouth daily. Hair loss   Humira Pen 40 MG/0.4ML Pnkt Generic drug: Adalimumab Inject 40 mg into the skin every 14 (fourteen) days. After the completion of the starter kit   multivitamin with minerals tablet Take 1 tablet by mouth daily.   sodium chloride 5 % ophthalmic ointment Commonly known as: MURO 976 Place 1 application into the left eye at bedtime.       Birth History: non-contributory  Developmental History: non-contributory  Past Surgical History: Past Surgical History:  Procedure Laterality Date  . EYE SURGERY    . KNEE SURGERY       Family History: Family History  Problem Relation Age of Onset  . Crohn's disease Father   . Other Other        HEALTHY  . Other Other        HEALTHY  . Other Other        HEALTHY  . Colon cancer Neg Hx   . Esophageal cancer Neg Hx      Social History: [name] lives at home with his wife in a house that is 56 years old.  There is  wood throughout the home.  They have gas heating and central cooling.  There are 2 dogs and 2 cats inside of the home.  There are dust mite covers on the bed, but not the pillows.  There is vaping exposure.  James Ortiz is not exposed to any fumes, chemicals, or dust.  James Ortiz does use a HEPA filter in the home.   Review of Systems  Constitutional: Negative.  Negative for fever, malaise/fatigue and weight loss.  HENT: Negative.  Negative for congestion, ear discharge and ear pain.   Eyes: Negative for pain, discharge and redness.  Respiratory: Negative for cough, sputum production, shortness of breath and wheezing.   Cardiovascular: Negative.  Negative for chest pain and palpitations.  Gastrointestinal: Negative for abdominal pain and heartburn.  Skin: Negative.  Negative for itching and rash.  Neurological: Negative for dizziness and headaches.  Endo/Heme/Allergies: Negative for environmental allergies. Does not bruise/bleed easily.       Objective:   Blood pressure 120/78, pulse 72, height 6\' 2"  (1.88 m), weight 191 lb 12.8 oz (87 kg), SpO2 96 %. Body mass index is 24.63 kg/m.   Physical Exam:   Physical Exam Constitutional:      Appearance: James Ortiz is well-developed.     Comments: Pleasant male.  HENT:     Head: Normocephalic and atraumatic.     Right Ear: Tympanic membrane, ear canal and external ear normal. No drainage, swelling or tenderness. Tympanic membrane is not injected, scarred, erythematous, retracted or bulging.     Left Ear: Tympanic membrane, ear canal and external ear normal. No drainage, swelling or tenderness. Tympanic membrane is not injected, scarred, erythematous, retracted or bulging.     Nose: No nasal deformity, septal deviation, mucosal edema, rhinorrhea or epistaxis.     Right Sinus: No maxillary sinus tenderness or frontal sinus tenderness.     Left Sinus: No maxillary sinus tenderness or frontal sinus tenderness.     Mouth/Throat:     Mouth: Oropharynx is clear and  moist. Mucous membranes are not pale and not dry.     Pharynx: Uvula midline.  Eyes:     General:        Right eye: No discharge.        Left eye: No discharge.     Extraocular Movements: EOM normal.     Conjunctiva/sclera: Conjunctivae normal.     Right eye: Right conjunctiva is not injected. No chemosis.    Left eye: Left conjunctiva is not injected. No chemosis.    Pupils: Pupils are equal, round, and reactive to light.  Cardiovascular:     Rate and Rhythm: Normal rate and regular rhythm.     Heart sounds: Normal heart sounds.  Pulmonary:     Effort: Pulmonary effort is normal. No tachypnea, accessory muscle usage or respiratory distress.     Breath sounds: Normal breath sounds. No wheezing, rhonchi or rales.  Chest:     Chest wall: No tenderness.  Abdominal:     Tenderness: There is no abdominal tenderness. There is no guarding or rebound.  Lymphadenopathy:     Head:     Right side of head: No submandibular, tonsillar or occipital adenopathy.     Left side of head: No submandibular, tonsillar or occipital adenopathy.     Cervical: No cervical adenopathy.  Skin:    Coloration: Skin is not pale.     Findings: No abrasion, erythema, petechiae or rash. Rash is not papular, urticarial or vesicular.  Neurological:     Mental Status: James Ortiz is alert.  Psychiatric:        Mood and Affect: Mood and affect normal.      Diagnostic studies:   Allergy Studies:     Airborne Adult Perc - 02/02/21 0956    Time Antigen Placed 0930    Allergen Manufacturer 02/04/21    Location Back    Number of Test 59    Panel 1 Select    1. Control-Buffer 50%  Glycerol Negative    2. Control-Histamine 1 mg/ml 2+    3. Albumin saline Negative    4. Hildebran Negative    5. [place] Negative    6. Johnson Negative    7. [employer] Blue Negative    8. Meadow Fescue Negative    9. Perennial Rye Negative    10. Sweet Vernal Negative    11. Timothy Negative    12. Cocklebur Negative    13. Burweed Marshelder  Negative    14. Ragweed, short Negative    15. Ragweed, Giant Negative    16. Plantain,  English 2+    17. Lamb's Quarters Negative    18. Sheep Sorrell Negative    19. Rough Pigweed Negative    20. Marsh Elder, Rough Negative    21. Mugwort, Common Negative    22. Ash mix Negative    23. Birch mix Negative    24. Beech American Negative    25. Box, Elder Negative    26. Cedar, red Negative    27. Cottonwood, [place] Negative    28. Elm mix Negative    29. Hickory Negative    30. Maple mix Negative    31. Oak, [place] mix Negative    32. Pecan Pollen Negative    33. Pine mix Negative    34. Sycamore Eastern Negative    35. Olga, Black Pollen Negative    36. Alternaria alternata Negative    37. Cladosporium Herbarum Negative    38. Aspergillus mix Negative    39. Penicillium mix Negative    40. Bipolaris sorokiniana (Helminthosporium) Negative    41. Drechslera spicifera (Curvularia) Negative    42. Mucor plumbeus Negative    43. Fusarium moniliforme Negative    44. Aureobasidium pullulans (pullulara) Negative    45. Rhizopus oryzae Negative    46. Botrytis cinera Negative    47. Epicoccum nigrum Negative    48. Phoma betae Negative    49. Candida Albicans Negative    50. Trichophyton mentagrophytes Negative    51. Mite, D Farinae  5,000 AU/ml Negative    52. Mite, D Pteronyssinus  5,000 AU/ml Negative    53. Cat Hair 10,000 BAU/ml Negative    54.  Dog Epithelia Negative    55. Mixed Feathers Negative    56. Horse Epithelia Negative    57. Cockroach, German Negative    58. Mouse Negative    59. Tobacco Leaf Negative          Food Perc - 02/02/21 0956      Test Information   Time Antigen Placed 0930    Allergen Manufacturer Lavella Hammock    Location Back    Number of allergen test 10      Food   1. Peanut Negative    2. Soybean food Negative    3. Wheat, whole Negative    4. Sesame Negative    5. Milk, cow Negative    6. Egg White, chicken Negative    7.  Casein Negative    8. Shellfish mix Negative    9. Fish mix Negative    10. Cashew Negative           Allergy testing results were read and interpreted by myself, documented by clinical staff.         Salvatore Marvel, MD [facility]

## 2021-02-02 NOTE — Patient Instructions (Addendum)
1. Acute urticaria - Testing today was essentially negative to the entire panel.  - You were slightly reactive to one weed, otherwise nothing came up. - This included the most common foods. - Copy of testing results provided/. - We can do a more thorough workup in the future if the hives return. - But right now, I think we are in a good place and do not need to pursue that.  2. Return if symptoms worsen or fail to improve.   Please inform us of any Emergency Department visits, hospitalizations, or changes in symptoms. Call us before going to the ED for breathing or allergy symptoms since we might be able to fit you in for a sick visit. Feel free to contact us anytime with any questions, problems, or concerns.  It was a pleasure to meet you today!  Websites that have reliable patient information: 1. American Academy of Asthma, Allergy, and Immunology: www.aaaai.org 2. Food Allergy Research and Education (FARE): foodallergy.org 3. Mothers of Asthmatics: http://www.asthmacommunitynetwork.org 4. American College of Allergy, Asthma, and Immunology: www.acaai.org   COVID-19 Vaccine Information can be found at: ShippingScam.co.uk For questions related to vaccine distribution or appointments, please email [email or web address] or call [phone].   We realize that you might be concerned about having an allergic reaction to the COVID19 vaccines. To help with that concern, WE ARE OFFERING THE COVID19 VACCINES IN OUR OFFICE! Ask the front desk for dates!     "Like" [place] on Facebook and Instagram for our latest updates!      A health democracy works best when [employer] participate! Make sure you are registered to vote! If you have moved or changed any of your contact information, you will need to get this updated before voting!  In some cases, you MAY be able to register to vote online:  [email or web address]      Airborne Adult Perc - 02/02/21 0956    Time Antigen Placed 0930    Allergen Manufacturer Lavella Hammock    Location Back    Number of Test 59    Panel 1 Select    1. Control-Buffer 50% Glycerol Negative    2. Control-Histamine 1 mg/ml 2+    3. Albumin saline Negative    4. Hernando Negative    5. [place] Negative    6. Johnson Negative    7. Alton Blue Negative    8. Meadow Fescue Negative    9. Perennial Rye Negative    10. Sweet Vernal Negative    11. Timothy Negative    12. Cocklebur Negative    13. Burweed Marshelder Negative    14. Ragweed, short Negative    15. Ragweed, Giant Negative    16. Plantain,  English 2+    17. Lamb's Quarters Negative    18. Sheep Sorrell Negative    19. Rough Pigweed Negative    20. Marsh Elder, Rough Negative    21. Mugwort, Common Negative    22. Ash mix Negative    23. Birch mix Negative    24. Beech American Negative    25. Box, Elder Negative    26. Cedar, red Negative    27. Cottonwood, [place] Negative    28. Elm mix Negative    29. Hickory Negative    30. Maple mix Negative    31. Oak, [place] mix Negative    32. Pecan Pollen Negative    33. Pine mix Negative    34. Sycamore Eastern Negative    35. Au Gres, Black Pollen  Negative    36. Alternaria alternata Negative    37. Cladosporium Herbarum Negative    38. Aspergillus mix Negative    39. Penicillium mix Negative    40. Bipolaris sorokiniana (Helminthosporium) Negative    41. Drechslera spicifera (Curvularia) Negative    42. Mucor plumbeus Negative    43. Fusarium moniliforme Negative    44. Aureobasidium pullulans (pullulara) Negative    45. Rhizopus oryzae Negative    46. Botrytis cinera Negative    47. Epicoccum nigrum Negative    48. Phoma betae Negative    49. Candida Albicans Negative    50. Trichophyton mentagrophytes Negative    51. Mite, D Farinae  5,000 AU/ml Negative    52. Mite, D Pteronyssinus  5,000 AU/ml  Negative    53. Cat Hair 10,000 BAU/ml Negative    54.  Dog Epithelia Negative    55. Mixed Feathers Negative    56. Horse Epithelia Negative    57. Cockroach, German Negative    58. Mouse Negative    59. Tobacco Leaf Negative          Food Perc - 02/02/21 0956      Test Information   Time Antigen Placed 0930    Allergen Manufacturer Lavella Hammock    Location Back    Number of allergen test 10      Food   1. Peanut Negative    2. Soybean food Negative    3. Wheat, whole Negative    4. Sesame Negative    5. Milk, cow Negative    6. Egg White, chicken Negative    7. Casein Negative    8. Shellfish mix Negative    9. Fish mix Negative    10. Cashew Negative

## 2021-03-03 ENCOUNTER — Other Ambulatory Visit: Payer: Self-pay | Admitting: Gastroenterology

## 2021-04-02 DIAGNOSIS — J0141 Acute recurrent pansinusitis: Secondary | ICD-10-CM | POA: Diagnosis not present

## 2021-04-02 DIAGNOSIS — J029 Acute pharyngitis, unspecified: Secondary | ICD-10-CM | POA: Diagnosis not present

## 2021-04-07 DIAGNOSIS — Z3009 Encounter for other general counseling and advice on contraception: Secondary | ICD-10-CM | POA: Diagnosis not present

## 2021-04-28 ENCOUNTER — Other Ambulatory Visit: Payer: Self-pay | Admitting: Gastroenterology

## 2021-04-28 NOTE — Telephone Encounter (Signed)
Spoke with patient to confirm current frequency of Humira and specialty pharmacy on file. Patient is also due for routine follow up for Crohn's disease. Patient has been scheduled for a follow up with Dr. Havery Moros on Thursday, 05/13/21 at 8:10 AM. Patient had no concerns at the end of the call.

## 2021-04-28 NOTE — Telephone Encounter (Signed)
For your review. Thanks

## 2021-05-13 ENCOUNTER — Ambulatory Visit: Payer: BC Managed Care – PPO | Admitting: Gastroenterology

## 2021-05-13 ENCOUNTER — Other Ambulatory Visit (INDEPENDENT_AMBULATORY_CARE_PROVIDER_SITE_OTHER): Payer: BC Managed Care – PPO

## 2021-05-13 ENCOUNTER — Encounter: Payer: Self-pay | Admitting: Gastroenterology

## 2021-05-13 VITALS — BP 104/60 | HR 93 | Ht 74.0 in | Wt 193.0 lb

## 2021-05-13 DIAGNOSIS — K50819 Crohn's disease of both small and large intestine with unspecified complications: Secondary | ICD-10-CM

## 2021-05-13 DIAGNOSIS — Z79899 Other long term (current) drug therapy: Secondary | ICD-10-CM

## 2021-05-13 DIAGNOSIS — Z23 Encounter for immunization: Secondary | ICD-10-CM

## 2021-05-13 LAB — CBC WITH DIFFERENTIAL/PLATELET
Basophils Absolute: 0 10*3/uL (ref 0.0–0.1)
Basophils Relative: 0.7 % (ref 0.0–3.0)
Eosinophils Absolute: 0.3 10*3/uL (ref 0.0–0.7)
Eosinophils Relative: 3.6 % (ref 0.0–5.0)
HCT: 42.7 % (ref 39.0–52.0)
Hemoglobin: 14.1 g/dL (ref 13.0–17.0)
Lymphocytes Relative: 34.8 % (ref 12.0–46.0)
Lymphs Abs: 2.5 10*3/uL (ref 0.7–4.0)
MCHC: 33 g/dL (ref 30.0–36.0)
MCV: 81.7 fl (ref 78.0–100.0)
Monocytes Absolute: 0.6 10*3/uL (ref 0.1–1.0)
Monocytes Relative: 8.8 % (ref 3.0–12.0)
Neutro Abs: 3.8 10*3/uL (ref 1.4–7.7)
Neutrophils Relative %: 52.1 % (ref 43.0–77.0)
Platelets: 209 10*3/uL (ref 150.0–400.0)
RBC: 5.23 Mil/uL (ref 4.22–5.81)
RDW: 13.5 % (ref 11.5–15.5)
WBC: 7.2 10*3/uL (ref 4.0–10.5)

## 2021-05-13 LAB — COMPREHENSIVE METABOLIC PANEL
ALT: 19 U/L (ref 0–53)
AST: 16 U/L (ref 0–37)
Albumin: 4.4 g/dL (ref 3.5–5.2)
Alkaline Phosphatase: 55 U/L (ref 39–117)
BUN: 18 mg/dL (ref 6–23)
CO2: 28 mEq/L (ref 19–32)
Calcium: 9.7 mg/dL (ref 8.4–10.5)
Chloride: 104 mEq/L (ref 96–112)
Creatinine, Ser: 0.95 mg/dL (ref 0.40–1.50)
GFR: 103.3 mL/min (ref >60.00)
Glucose, Bld: 89 mg/dL (ref 70–99)
Potassium: 4.1 mEq/L (ref 3.5–5.1)
Sodium: 139 mEq/L (ref 135–145)
Total Bilirubin: 1 mg/dL (ref 0.2–1.2)
Total Protein: 7.3 g/dL (ref 6.0–8.3)

## 2021-05-13 LAB — VITAMIN D 25 HYDROXY (VIT D DEFICIENCY, FRACTURES): VITD: 31.29 ng/mL (ref 30.00–100.00)

## 2021-05-13 MED ORDER — SUTAB 1479-225-188 MG PO TABS: 1.0000 | ORAL_TABLET | Freq: Once | ORAL | 0 refills | Status: AC

## 2021-05-13 NOTE — Progress Notes (Signed)
HPI :  Crohn's disease history: He has a history of ileal Crohn's disease. He was diagnosed several years ago, remotely on thiopurines for a period of time but not on therapy for several years. Was admitted in 2020 with an obstruction at the IC valve. Follow up colonoscopy with me March 2020 showed a distorted IC valve / stenosis at the ileum which could not be traversed. He was referred to Renelda Mom at [facility] who performed ileocecectomy in April 2020.Surgery went quite well and his obstructive symptoms resolved. Path from his operative report showed chronic active inflammation but no pre-cancerous changes. Historically he has wanted to avoid biologic or medical therapy for his Crohn's diseaseso we monitored post operatively.He underwent a colonoscopy with me on July 152021. He had active inflammation at the anastomosis and ileum, Rutgeert's score i2.Unfortunately through his colon he also had numerous erosions concerning for mild colonic activity. Biopsies confirmed Crohn's colitis in these areas as well. Initially placed on Humira + thiopurine. 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   SINCE LAST VISIT  Here is here for routine follow-up.  Recall the last visit he had developed hives for several weeks that was managed with Benadryl.  Ultimately was seen by an allergist, had negative allergy testing.  Was given a shot of cortisone and the hives went away and did not recur.  Has been doing pretty well in that regard.  He is been feeling well without any abdominal pains.  Bowels seem fairly regular, does have some occasional mild urgency.  No blood in his stools.  He denies other complaints at this time.  His last colonoscopy was in 2021 will be made the switch to biologic therapy.  He continues on Humira monotherapy every other week, as above not on thiopurine's due to hepatotoxicity.  He has not had a follow-up colonoscopy.  Fecal calprotectin was negative  last year.   IBD Health Care Maintenance: Annual Flu Vaccine - Date 2021 UTD Pneumococcal Vaccine - PCV13 done 07/14/20 Zoster vaccine if over age 81:  TB testing if on anti-TNF, yearly - Date 72021 Vitamin D screening - Date DUE Last Colonoscopy - Date 72021 COVID VACCINE x 2 + Booster  Colonoscopy 03/01/19 -The perianal and digital rectal examinations were normal. - The IC valve was deformed and scarred down from Crohn's disease. A ? fistulous tract was noted just inferior to the valve. A benign-appearing, intrinsic severe stenosis was found at the ileocecal valve. The lumen was only a few mm wide and could not be traversed. No obvious inflammatory change was noted. Biopsies were taken with a cold forceps for histology. - Polypoid lesions were found in the cecum and at the appendiceal orifice, most consistent with benign pseudoinflammatory polyps. Biopsies were taken with a cold forceps for histology. - Scattered medium-mouthed diverticula were found in the entire colon. - Internal hemorrhoids were found during retroflexion. The hemorrhoids were small. - The exam was otherwise without abnormality. No colonic inflammation  Surgery 04/12/19 - ileocecectomy - Dr. Renelda Mom Path shows chronic active inflammation, with polypoid lesion in the cecum removed - no evidence of atypical lymphoid proliferation  CT scan 06/26/19 - s/p ileocecectomy, multiple benign appearing lymph nodes in RLQ, benign small pulmonary nodule  Colonoscopy 07/02/20 -The perianal and digital rectal examinations were normal. - There was evidence of a prior end-to-side ileo-colonic anastomosis in the ascending colon. This was patent and was characterized by a few apthous ulcerations but was widely patent. - Patchy  inflammation, graded as Rutgeerts Score i2 (more than five aphthous lesions with normal intervening mucosa or skip areas of larger lesions or lesions confined to the ileocolonic anastomosis) and  characterized by erosions and aphthous ulcerations was found in the terminal ileum. - A few isolated erosions were found in the descending colon, in the transverse colon and in the ascending colon. Biopsies were taken with a cold forceps for histology. - Scattered small-mouthed diverticula were found in the transverse colon and left colon. - Internal hemorrhoids were found during retroflexion. - The exam was otherwise without abnormality.  Diagnosis Surgical [P], colon - CHRONIC ACTIVE COLITIS - NO GRANULOMATA, DYSPLASIA OR MALIGNANCY IDENTIFIED - SEE COMMENT  Labs 02/01/19 - TB negative, TPMT 20.1, hep B testing negative 2020  Fecal calprotectin 23    Past Medical History:  Diagnosis Date  . Chest pain   . Contusion of rib on left side   . Corneal abrasion    non-healing, wore a permanent contact and finally removed yesterday  . Crohn's disease ([place])    ileal disease s/p ileocecectomy   . RUQ pain   . Sinusitis   . Urticaria      Past Surgical History:  Procedure Laterality Date  . EYE SURGERY    . KNEE SURGERY     Family History  Problem Relation Age of Onset  . Crohn's disease Father   . Other Other        HEALTHY  . Other Other        HEALTHY  . Other Other        HEALTHY  . Colon cancer Neg Hx   . Esophageal cancer Neg Hx    Social History   Tobacco Use  . Smoking status: Former Smoker    Packs/day: 1.00    Years: 12.00    Pack years: 12.00  . Smokeless tobacco: Never Used  Vaping Use  . Vaping Use: Former  . Devices: trying to quit   Substance Use Topics  . Alcohol use: Yes    Comment: h/o heavy use  . Drug use: No   Current Outpatient Medications  Medication Sig Dispense Refill  . Adalimumab (HUMIRA PEN) 40 MG/0.4ML PNKT Inject 1 pen into the skin every 14 (fourteen) days. 2 each 5  . carboxymethylcellulose (REFRESH PLUS) 0.5 % SOLN Place 1 drop into the left eye 3 (three) times daily as needed.    . finasteride (PROPECIA) 1 MG tablet  Take 1 mg by mouth daily. Hair loss    . Multiple Vitamins-Minerals (MULTIVITAMIN WITH MINERALS) tablet Take 1 tablet by mouth daily.    . sodium chloride (MURO 128) 5 % ophthalmic ointment Place 1 application into the left eye at bedtime.     No current facility-administered medications for this visit.   Allergies  Allergen Reactions  . Milk-Related Compounds   . Other Other (See Comments)    Lactose intolerant but able to consume cheese. Avoids milk, ice cream, pudding. MM, RD 04/15/19 Lactose intolerant but able to consume cheese. Avoids milk, ice cream, pudding. MM, RD 04/15/19      Review of Systems: All systems reviewed and negative except where noted in HPI.   Lab Results  Component Value Date   WBC 7.2 05/13/2021   HGB 14.1 05/13/2021   HCT 42.7 05/13/2021   MCV 81.7 05/13/2021   PLT 209.0 05/13/2021    Lab Results  Component Value Date   CREATININE 0.95 05/13/2021   BUN 18 05/13/2021  NA 139 05/13/2021   K 4.1 05/13/2021   CL 104 05/13/2021   CO2 28 05/13/2021    Lab Results  Component Value Date   ALT 19 05/13/2021   AST 16 05/13/2021   ALKPHOS 55 05/13/2021   BILITOT 1.0 05/13/2021     Physical Exam: BP 104/60   Pulse 93   Ht 6' 2"  (1.88 m)   Wt 193 lb (87.5 kg)   BMI 24.78 kg/m  Constitutional: Pleasant,well-developed, male in no acute distress. Neurological: Alert and oriented to person place and time. Psychiatric: Normal mood and affect. Behavior is normal.   ASSESSMENT AND PLAN: 35 year old male here for reassessment of the following:  Crohn's disease High risk medication use  As above, ileocolonic Crohn's disease, status post surgery in 2020.  Postoperatively doing well, intolerant to thiopurines which caused hepatotoxicity.  He has been on Humira monotherapy now for a few years.  Since his last visit has seen the allergist for hives that developed on this regimen however that has since resolved without recurrence which is good.  He is  clinically feeling pretty well, but due for surveillance colonoscopy at this time to assess his response to Humira.  I am recommending colonoscopy, discussed risks and benefits and he is agreeable to proceed with that.  Further recommendations pending that result.  He is due for basic labs today including checking vitamin D levels as well as QuantiFERON gold.  I reviewed his immunizations with him, he is due for PPSV23 and that was administered today.  He may also consider Shingrix on his own.  We will continue Humira for now, further recommendations pending results of his colonoscopy, he will contact me in the interim with any questions.  All questions answered  Plan: - CBC, CMET, vitamin D, quantiferon gold today - PPSV23 vaccination today - scheduled for colonoscopy for surveillance - continue present dosing of Humira - hives have resolved, will monitor for recurrence  [place], MD [facility]

## 2021-05-13 NOTE — Patient Instructions (Addendum)
If you are age 36 or older, your body mass index should be between 23-30. Your Body mass index is 24.78 kg/m. If this is out of the aforementioned range listed, please consider follow up with your Primary Care Provider.  If you are age 74 or younger, your body mass index should be between 19-25. Your Body mass index is 24.78 kg/m. If this is out of the aformentioned range listed, please consider follow up with your Primary Care Provider.   ____________________________________________________________________________________________________________________________________________________  The [facility] providers would like to encourage you to use [facility] to communicate with providers for non-urgent requests or questions.  Due to long hold times on the telephone, sending your provider a message by [facility] may be a faster and more efficient way to get a response.  Please allow 48 business hours for a response.  Please remember that this is for non-urgent requests.    You have been scheduled for a colonoscopy. Please follow written instructions given to you at your visit today.  Please pick up your prep supplies at the pharmacy within the next 1-3 days. If you use inhalers (even only as needed), please bring them with you on the day of your procedure.   Please go to the lab in the basement of our building to have lab work done as you leave today. Hit "B" for basement when you get on the elevator.  When the doors open the lab is on your left.  We will call you with the results. Thank you.  Due to recent changes in healthcare laws, you may see the results of your imaging and laboratory studies on MyChart before your provider has had a chance to review them.  We understand that in some cases there may be results that are confusing or concerning to you. Not all laboratory results come back in the same time frame and the provider may be waiting for multiple results in order to interpret others.  Please give  [place] 48 hours in order for your provider to thoroughly review all the results before contacting the office for clarification of your results.   We are giving you the Pneumococcal 23 vaccine today.  Thank you for entrusting me with your care and for choosing [facility], Dr. Crittenden Cellar

## 2021-05-15 LAB — QUANTIFERON-TB GOLD PLUS
Mitogen-NIL: >10 IU/mL
NIL: 0.02 IU/mL
QuantiFERON-TB Gold Plus: NEGATIVE
TB1-NIL: 0 IU/mL
TB2-NIL: 0.01 IU/mL

## 2021-06-05 DIAGNOSIS — K50819 Crohn's disease of both small and large intestine with unspecified complications: Secondary | ICD-10-CM

## 2021-06-05 DIAGNOSIS — Z79899 Other long term (current) drug therapy: Secondary | ICD-10-CM

## 2021-06-08 ENCOUNTER — Other Ambulatory Visit: Payer: Self-pay | Admitting: Gastroenterology

## 2021-06-08 DIAGNOSIS — R109 Unspecified abdominal pain: Secondary | ICD-10-CM

## 2021-06-08 NOTE — Telephone Encounter (Signed)
Lab orders in epic.

## 2021-06-11 ENCOUNTER — Other Ambulatory Visit (INDEPENDENT_AMBULATORY_CARE_PROVIDER_SITE_OTHER): Payer: BC Managed Care – PPO

## 2021-06-11 DIAGNOSIS — R109 Unspecified abdominal pain: Secondary | ICD-10-CM

## 2021-06-11 LAB — URINALYSIS, ROUTINE W REFLEX MICROSCOPIC
Bilirubin Urine: NEGATIVE
Hgb urine dipstick: NEGATIVE
Ketones, ur: NEGATIVE
Leukocytes,Ua: NEGATIVE
Nitrite: NEGATIVE
RBC / HPF: NONE SEEN (ref 0–?)
Specific Gravity, Urine: 1.015 (ref 1.000–1.030)
Total Protein, Urine: NEGATIVE
Urine Glucose: NEGATIVE
Urobilinogen, UA: 0.2 (ref 0.0–1.0)
WBC, UA: NONE SEEN (ref 0–?)
pH: 7 (ref 5.0–8.0)

## 2021-06-11 LAB — CBC WITH DIFFERENTIAL/PLATELET
Basophils Absolute: 0 10*3/uL (ref 0.0–0.1)
Basophils Relative: 0.4 % (ref 0.0–3.0)
Eosinophils Absolute: 0.1 10*3/uL (ref 0.0–0.7)
Eosinophils Relative: 2.1 % (ref 0.0–5.0)
HCT: 40 % (ref 39.0–52.0)
Hemoglobin: 13.4 g/dL (ref 13.0–17.0)
Lymphocytes Relative: 43.8 % (ref 12.0–46.0)
Lymphs Abs: 3 10*3/uL (ref 0.7–4.0)
MCHC: 33.5 g/dL (ref 30.0–36.0)
MCV: 80 fl (ref 78.0–100.0)
Monocytes Absolute: 0.5 10*3/uL (ref 0.1–1.0)
Monocytes Relative: 7.7 % (ref 3.0–12.0)
Neutro Abs: 3.1 10*3/uL (ref 1.4–7.7)
Neutrophils Relative %: 46 % (ref 43.0–77.0)
Platelets: 197 10*3/uL (ref 150.0–400.0)
RBC: 5 Mil/uL (ref 4.22–5.81)
RDW: 14 % (ref 11.5–15.5)
WBC: 6.8 10*3/uL (ref 4.0–10.5)

## 2021-06-11 LAB — COMPREHENSIVE METABOLIC PANEL
ALT: 21 U/L (ref 0–53)
AST: 17 U/L (ref 0–37)
Albumin: 4.3 g/dL (ref 3.5–5.2)
Alkaline Phosphatase: 52 U/L (ref 39–117)
BUN: 17 mg/dL (ref 6–23)
CO2: 26 mEq/L (ref 19–32)
Calcium: 9.6 mg/dL (ref 8.4–10.5)
Chloride: 103 mEq/L (ref 96–112)
Creatinine, Ser: 0.93 mg/dL (ref 0.40–1.50)
GFR: 105.91 mL/min (ref >60.00)
Glucose, Bld: 98 mg/dL (ref 70–99)
Potassium: 3.8 mEq/L (ref 3.5–5.1)
Sodium: 138 mEq/L (ref 135–145)
Total Bilirubin: 1.1 mg/dL (ref 0.2–1.2)
Total Protein: 7.1 g/dL (ref 6.0–8.3)

## 2021-06-29 ENCOUNTER — Telehealth: Payer: Self-pay

## 2021-06-29 NOTE — Telephone Encounter (Signed)
Received a fax from [facility] that they have initiated a PA for patient's Humira prescription. PA submitted via cover my meds today. Will await insurance response.  KEY: [id number]

## 2021-07-06 NOTE — Telephone Encounter (Signed)
Spoke with [employer] in the PA department at [employer]. She states that patient's previous authorization does not expire until 07/20/21 and that is probably which I have not received a response. I was able to do a verbal PA for Humira 40 mg/0.4 ml.   Approved effective 07/06/21 - 07/06/22 Reference # [id number]

## 2021-07-21 DIAGNOSIS — Z302 Encounter for sterilization: Secondary | ICD-10-CM | POA: Diagnosis not present

## 2021-08-20 ENCOUNTER — Ambulatory Visit (AMBULATORY_SURGERY_CENTER): Payer: BC Managed Care – PPO | Admitting: Gastroenterology

## 2021-08-20 ENCOUNTER — Encounter: Payer: Self-pay | Admitting: Gastroenterology

## 2021-08-20 ENCOUNTER — Other Ambulatory Visit: Payer: Self-pay

## 2021-08-20 VITALS — BP 101/60 | HR 69 | Temp 97.1°F | Resp 17 | Ht 74.0 in | Wt 193.0 lb

## 2021-08-20 DIAGNOSIS — K50819 Crohn's disease of both small and large intestine with unspecified complications: Secondary | ICD-10-CM | POA: Diagnosis not present

## 2021-08-20 DIAGNOSIS — K635 Polyp of colon: Secondary | ICD-10-CM

## 2021-08-20 DIAGNOSIS — K529 Noninfective gastroenteritis and colitis, unspecified: Secondary | ICD-10-CM | POA: Diagnosis not present

## 2021-08-20 DIAGNOSIS — D125 Benign neoplasm of sigmoid colon: Secondary | ICD-10-CM

## 2021-08-20 HISTORY — PX: COLONOSCOPY: SHX174

## 2021-08-20 MED ORDER — SODIUM CHLORIDE 0.9 % IV SOLN: 500.0000 mL | Freq: Once | INTRAVENOUS | Status: DC

## 2021-08-20 NOTE — Op Note (Signed)
[facility] Patient Name: James Ortiz Procedure Date: 08/20/2021 8:43 AM MRN: [id number] Endoscopist: Remo Lipps P. Havery Moros , MD Age: 35 Referring MD:  Date of Birth: Feb 02, 1986 Gender: Male Account #: [id number] Procedure:                Colonoscopy Indications:              Follow-up of Crohn's disease of the small bowel and                            colon - s/p surgical resection now on Humira post                            op after last colonoscopy showed recurrent active                            disease. Clinically doing well. Medicines:                Monitored Anesthesia Care Procedure:                Pre-Anesthesia Assessment:                           - Prior to the procedure, a History and Physical                            was performed, and patient medications and                            allergies were reviewed. The patient's tolerance of                            previous anesthesia was also reviewed. The risks                            and benefits of the procedure and the sedation                            options and risks were discussed with the patient.                            All questions were answered, and informed consent                            was obtained. Prior Anticoagulants: The patient has                            taken no previous anticoagulant or antiplatelet                            agents. ASA Grade Assessment: II - A patient with                            mild systemic disease. After reviewing the risks  and benefits, the patient was deemed in                            satisfactory condition to undergo the procedure.                           After obtaining informed consent, the colonoscope                            was passed under direct vision. Throughout the                            procedure, the patient's blood pressure, pulse, and                            oxygen saturations were  monitored continuously. The                            Olympus CF-HQ190L ([id number]) Colonoscope was                            introduced through the anus and advanced to the the                            terminal ileum. The colonoscopy was performed                            without difficulty. The patient tolerated the                            procedure well. The quality of the bowel                            preparation was good. The terminal ileum and the                            rectum, surgical anastomosis were photographed. Scope In: 9:04:57 AM Scope Out: 9:20:48 AM Scope Withdrawal Time: 0 hours 12 minutes 57 seconds  Total Procedure Duration: 0 hours 15 minutes 51 seconds  Findings:                 The perianal and digital rectal examinations were                            normal.                           The terminal ileum appeared normal.                           There was evidence of a prior end-to-end                            ileo-colonic anastomosis in the ascending colon.  This was patent and was characterized by healthy                            appearing mucosa. The anastomosis was traversed.                           Scattered medium-mouthed diverticula were found in                            the transverse colon and left colon.                           A diminutive polyp was found in the sigmoid colon.                            The polyp was sessile. The polyp was removed with a                            cold biopsy forceps. Resection and retrieval were                            complete.                           Internal hemorrhoids were found during retroflexion.                           An area of mildly altered mucosa was found in the                            distal rectum, possible very mildly active colitis.                            Biopsies were taken with a cold forceps for                             histology.                           The exam was otherwise without abnormality.                           Biopsies were taken with a cold forceps in the                            sigmoid colon, in the descending colon, in the                            transverse colon and in the ascending colon for                            histology. Complications:            No immediate complications. Estimated blood loss:  Minimal. Estimated Blood Loss:     Estimated blood loss was minimal. Impression:               - The examined portion of the ileum was normal.                           - Patent end-to-end ileo-colonic anastomosis,                            characterized by healthy appearing mucosa.                           - Diverticulosis in the transverse colon and in the                            left colon.                           - One diminutive polyp in the sigmoid colon,                            removed with a cold biopsy forceps. Resected and                            retrieved.                           - Internal hemorrhoids.                           - Altered mucosa in the distal rectum, possible                            mild active colitis vs. bowel prep artifact.                            Biopsied.                           - The examination was otherwise normal.                           - Biopsies were taken with a cold forceps for                            histology in the sigmoid colon, in the descending                            colon, in the transverse colon and in the ascending                            colon.                           Overall, good control of disease on Humira -  anastomosis and ileum has intervally healed. No                            significant colonic inflammation Recommendation:           - Patient has a contact number available for                            emergencies. The signs  and symptoms of potential                            delayed complications were discussed with the                            patient. Return to normal activities tomorrow.                            Written discharge instructions were provided to the                            patient.                           - Resume previous diet.                           - Continue present medications.                           - Await pathology results. Remo Lipps P. Luan Urbani, MD 08/20/2021 9:27:50 AM This report has been signed electronically.

## 2021-08-20 NOTE — Progress Notes (Signed)
To PACU, VSS. Report to [email or web address] 

## 2021-08-20 NOTE — Progress Notes (Signed)
[facility] History and Physical   Primary Care Physician:  Patient, No Pcp Per (Inactive)   Reason for Procedure:   Crohn's disease  Plan:    Colonoscopy     HPI: James Ortiz is a 35 y.o. male with ahistory of Crohn's disease on Humira here for surveillance colonoscopy, assess response to therapy and restage disease. Feeling well without complaints at this time otherwise.    Past Medical History:  Diagnosis Date   Chest pain    negative stress test   Contusion of rib on left side    Corneal abrasion    non-healing, wore a permanent contact and finally removed yesterday   Crohn's disease ([place])    ileal disease s/p ileocecectomy    RUQ pain    Sinusitis    Urticaria     Past Surgical History:  Procedure Laterality Date   COLONOSCOPY  08/20/2021   EYE SURGERY     KNEE SURGERY     VASECTOMY      Prior to Admission medications   Medication Sig Start Date End Date Taking? Authorizing Provider  carboxymethylcellulose (REFRESH PLUS) 0.5 % SOLN Place 1 drop into the left eye 3 (three) times daily as needed.   Yes [provider]  finasteride (PROPECIA) 1 MG tablet Take 1 mg by mouth daily. Hair loss   Yes [provider]  Multiple Vitamins-Minerals (MULTIVITAMIN WITH MINERALS) tablet Take 1 tablet by mouth daily.   Yes [provider]  sodium chloride (MURO 128) 5 % ophthalmic ointment Place 1 application into the left eye at bedtime.   Yes [provider]  Adalimumab (HUMIRA PEN) 40 MG/0.4ML PNKT Inject 1 pen into the skin every 14 (fourteen) days. 04/28/21   Oseph Imburgia, Carlota Raspberry, MD    Current Outpatient Medications  Medication Sig Dispense Refill   carboxymethylcellulose (REFRESH PLUS) 0.5 % SOLN Place 1 drop into the left eye 3 (three) times daily as needed.     finasteride (PROPECIA) 1 MG tablet Take 1 mg by mouth daily. Hair loss     Multiple Vitamins-Minerals (MULTIVITAMIN WITH MINERALS) tablet Take 1 tablet by  mouth daily.     sodium chloride (MURO 128) 5 % ophthalmic ointment Place 1 application into the left eye at bedtime.     Adalimumab (HUMIRA PEN) 40 MG/0.4ML PNKT Inject 1 pen into the skin every 14 (fourteen) days. 2 each 5   Current Facility-Administered Medications  Medication Dose Route Frequency Provider Last Rate Last Admin   0.9 %  sodium chloride infusion  500 mL Intravenous Once Legend Pecore, Carlota Raspberry, MD        Allergies as of 08/20/2021 - Review Complete 08/20/2021  Allergen Reaction Noted   Milk-related compounds     Other Other (See Comments) 04/15/2019    Family History  Problem Relation Age of Onset   Crohn's disease Father    Other Other        HEALTHY   Other Other        HEALTHY   Other Other        HEALTHY   Colon cancer Neg Hx    Esophageal cancer Neg Hx    Stomach cancer Neg Hx     Social History   Socioeconomic History   Marital status: Married    Spouse name: Not on file   Number of children: Not on file   Years of education: Not on file   Highest education level: Not on file  Occupational  History   Occupation: Optometrist  Tobacco Use   Smoking status: Former    Packs/day: 1.00    Years: 12.00    Pack years: 12.00    Types: Cigarettes   Smokeless tobacco: Never  Vaping Use   Vaping Use: Former   Devices: trying to quit   Substance and Sexual Activity   Alcohol use: Yes    Comment: occasionally   Drug use: No   Sexual activity: Yes  Other Topics Concern   Not on file  Social History Narrative   Not on file   Social Determinants of Health   Financial Resource Strain: Not on file  Food Insecurity: Not on file  Transportation Needs: Not on file  Physical Activity: Not on file  Stress: Not on file  Social Connections: Not on file  Intimate Partner Violence: Not on file    Review of Systems: All other review of systems negative except as mentioned in the HPI.  Physical Exam: Vital signs BP 98/71   Pulse (!) 102   Temp (!)  97.1 F (36.2 C)   Resp 12   Ht 6' 2"  (1.88 m)   Wt 193 lb (87.5 kg)   SpO2 98%   BMI 24.78 kg/m   General:   Alert,  Well-developed, well-nourished, pleasant and cooperative in NAD Lungs:  Clear throughout to auscultation.   Heart:  Regular rate and rhythm;  Abdomen:  Soft, nontender and nondistended.   Neuro/Psych:  Alert and cooperative. Normal mood and affect. A and O x 3  Jolly Mango, MD [facility]

## 2021-08-20 NOTE — Progress Notes (Signed)
Called to room to assist during endoscopic procedure.  Patient ID and intended procedure confirmed with present staff. Received instructions for my participation in the procedure from the performing physician.  

## 2021-08-20 NOTE — Patient Instructions (Signed)
Await pathology  Please read over handout about polyps  Continue your normal medications   YOU HAD AN ENDOSCOPIC PROCEDURE TODAY AT [facility]:   Refer to the procedure report that was given to you for any specific questions about what was found during the examination.  If the procedure report does not answer your questions, please call your gastroenterologist to clarify.  If you requested that your care partner not be given the details of your procedure findings, then the procedure report has been included in a sealed envelope for you to review at your convenience later.  YOU SHOULD EXPECT: Some feelings of bloating in the abdomen. Passage of more gas than usual.  Walking can help get rid of the air that was put into your GI tract during the procedure and reduce the bloating. If you had a lower endoscopy (such as a colonoscopy or flexible sigmoidoscopy) you may notice spotting of blood in your stool or on the toilet paper. If you underwent a bowel prep for your procedure, you may not have a normal bowel movement for a few days.  Please Note:  You might notice some irritation and congestion in your nose or some drainage.  This is from the oxygen used during your procedure.  There is no need for concern and it should clear up in a day or so.  SYMPTOMS TO REPORT IMMEDIATELY:  Following lower endoscopy (colonoscopy or flexible sigmoidoscopy):  Excessive amounts of blood in the stool  Significant tenderness or worsening of abdominal pains  Swelling of the abdomen that is new, acute  Fever of 100F or higher  For urgent or emergent issues, a gastroenterologist can be reached at any hour by calling [phone]. Do not use MyChart messaging for urgent concerns.    DIET:  We do recommend a small meal at first, but then you may proceed to your regular diet.  Drink plenty of fluids but you should avoid alcoholic beverages for 24 hours.  ACTIVITY:  You should plan to take it easy  for the rest of today and you should NOT DRIVE or use heavy machinery until tomorrow (because of the sedation medicines used during the test).    FOLLOW UP: Our staff will call the number listed on your records 48-72 hours following your procedure to check on you and address any questions or concerns that you may have regarding the information given to you following your procedure. If we do not reach you, we will leave a message.  We will attempt to reach you two times.  During this call, we will ask if you have developed any symptoms of COVID 19. If you develop any symptoms (ie: fever, flu-like symptoms, shortness of breath, cough etc.) before then, please call [phone].  If you test positive for Covid 19 in the 2 weeks post procedure, please call and report this information to [place].    If any biopsies were taken you will be contacted by phone or by letter within the next 1-3 weeks.  Please call us at [phone] if you have not heard about the biopsies in 3 weeks.    SIGNATURES/CONFIDENTIALITY: You and/or your care partner have signed paperwork which will be entered into your electronic medical record.  These signatures attest to the fact that that the information above on your After Visit Summary has been reviewed and is understood.  Full responsibility of the confidentiality of this discharge information lies with you and/or your care-partner.

## 2021-08-24 ENCOUNTER — Telehealth: Payer: Self-pay

## 2021-08-24 NOTE — Telephone Encounter (Signed)
  Follow up Call-  Call back number 08/20/2021 07/02/2020 03/01/2019  Post procedure Call Back phone  # [phone]  Permission to leave phone message Yes Yes Yes  Some recent data might be hidden     Patient questions:  Do you have a fever, pain , or abdominal swelling? No. Pain Score  0 *  Have you tolerated food without any problems? Yes.    Have you been able to return to your normal activities? Yes.    Do you have any questions about your discharge instructions: Diet   No. Medications  No. Follow up visit  No.  Do you have questions or concerns about your Care? No.  Actions: * If pain score is 4 or above: No action needed, pain <4.  Have you developed a fever since your procedure? no  2.   Have you had an respiratory symptoms (SOB or cough) since your procedure? no  3.   Have you tested positive for COVID 19 since your procedure no  4.   Have you had any family members/close contacts diagnosed with the COVID 19 since your procedure?  no   If yes to any of these questions please route to Joylene John, RN and Joella Prince, RN

## 2021-11-09 ENCOUNTER — Other Ambulatory Visit: Payer: Self-pay | Admitting: Gastroenterology

## 2022-02-07 ENCOUNTER — Telehealth: Payer: Self-pay | Admitting: Gastroenterology

## 2022-02-07 NOTE — Telephone Encounter (Signed)
Inbound call from patient states need a prescription for Shingles vaccine. States it was recommended by Dr. Havery Moros

## 2022-02-07 NOTE — Telephone Encounter (Signed)
Order for Shingrix vaccine faxed to [facility] on [address].

## 2022-02-17 DIAGNOSIS — N4889 Other specified disorders of penis: Secondary | ICD-10-CM | POA: Diagnosis not present

## 2022-02-17 DIAGNOSIS — N489 Disorder of penis, unspecified: Secondary | ICD-10-CM | POA: Diagnosis not present

## 2022-04-13 ENCOUNTER — Encounter: Payer: Self-pay | Admitting: Gastroenterology

## 2022-05-25 ENCOUNTER — Other Ambulatory Visit: Payer: Self-pay | Admitting: Gastroenterology

## 2022-05-25 DIAGNOSIS — H18833 Recurrent erosion of cornea, bilateral: Secondary | ICD-10-CM | POA: Diagnosis not present

## 2022-05-25 DIAGNOSIS — H18523 Epithelial (juvenile) corneal dystrophy, bilateral: Secondary | ICD-10-CM | POA: Diagnosis not present

## 2022-05-25 NOTE — Telephone Encounter (Signed)
Thanks [place], can refill his Humira and yes I would like to see him back in the office for routine visit.  Thanks

## 2022-05-26 NOTE — Telephone Encounter (Signed)
Called and spoke with patient. He is aware that I have refilled his Humira. Pt has been scheduled for a routine f/u appt with Dr. Havery Moros on Wednesday, 07/13/22 at 8:10 am. Pt verbalized understanding and had no concerns at the end of the call.

## 2022-06-13 ENCOUNTER — Telehealth: Payer: Self-pay | Admitting: Pharmacy Technician

## 2022-06-13 ENCOUNTER — Encounter: Payer: Self-pay | Admitting: Gastroenterology

## 2022-06-13 ENCOUNTER — Other Ambulatory Visit (HOSPITAL_COMMUNITY): Payer: Self-pay

## 2022-06-13 NOTE — Telephone Encounter (Signed)
Patient Advocate Encounter  Received notification from [employer] that RENEWAL prior authorization for HUMIRA 40MG  is required.   PA submitted on 6.26.23 Key [id number] Status is pending    Ricke Hey, CPhT Patient Advocate Phone: [phone]

## 2022-06-17 DIAGNOSIS — J342 Deviated nasal septum: Secondary | ICD-10-CM | POA: Diagnosis not present

## 2022-06-17 DIAGNOSIS — J343 Hypertrophy of nasal turbinates: Secondary | ICD-10-CM | POA: Diagnosis not present

## 2022-06-17 DIAGNOSIS — J310 Chronic rhinitis: Secondary | ICD-10-CM | POA: Diagnosis not present

## 2022-06-24 DIAGNOSIS — Z6824 Body mass index (BMI) 24.0-24.9, adult: Secondary | ICD-10-CM | POA: Diagnosis not present

## 2022-06-24 DIAGNOSIS — R051 Acute cough: Secondary | ICD-10-CM | POA: Diagnosis not present

## 2022-06-24 DIAGNOSIS — J301 Allergic rhinitis due to pollen: Secondary | ICD-10-CM | POA: Diagnosis not present

## 2022-06-27 DIAGNOSIS — J40 Bronchitis, not specified as acute or chronic: Secondary | ICD-10-CM | POA: Diagnosis not present

## 2022-06-27 DIAGNOSIS — R051 Acute cough: Secondary | ICD-10-CM | POA: Diagnosis not present

## 2022-06-28 NOTE — Telephone Encounter (Signed)
Prior Authorization for Humira Pen 63m/0.4mL is APPROVED  Key: [id number]  PA Case ID: [id number]:06/13/2022 END:    06/13/2023

## 2022-07-07 ENCOUNTER — Encounter: Payer: Self-pay | Admitting: Gastroenterology

## 2022-07-07 ENCOUNTER — Other Ambulatory Visit (INDEPENDENT_AMBULATORY_CARE_PROVIDER_SITE_OTHER): Payer: BC Managed Care – PPO

## 2022-07-07 DIAGNOSIS — Z79899 Other long term (current) drug therapy: Secondary | ICD-10-CM

## 2022-07-07 DIAGNOSIS — K50819 Crohn's disease of both small and large intestine with unspecified complications: Secondary | ICD-10-CM

## 2022-07-07 LAB — CBC WITH DIFFERENTIAL/PLATELET
Basophils Absolute: 0.1 10*3/uL (ref 0.0–0.1)
Basophils Relative: 0.6 % (ref 0.0–3.0)
Eosinophils Absolute: 0.2 10*3/uL (ref 0.0–0.7)
Eosinophils Relative: 1.7 % (ref 0.0–5.0)
HCT: 43.3 % (ref 39.0–52.0)
Hemoglobin: 14.1 g/dL (ref 13.0–17.0)
Lymphocytes Relative: 29.9 % (ref 12.0–46.0)
Lymphs Abs: 2.9 10*3/uL (ref 0.7–4.0)
MCHC: 32.5 g/dL (ref 30.0–36.0)
MCV: 80.6 fl (ref 78.0–100.0)
Monocytes Absolute: 0.7 10*3/uL (ref 0.1–1.0)
Monocytes Relative: 6.7 % (ref 3.0–12.0)
Neutro Abs: 5.9 10*3/uL (ref 1.4–7.7)
Neutrophils Relative %: 61.1 % (ref 43.0–77.0)
Platelets: 274 10*3/uL (ref 150.0–400.0)
RBC: 5.37 Mil/uL (ref 4.22–5.81)
RDW: 14.6 % (ref 11.5–15.5)
WBC: 9.6 10*3/uL (ref 4.0–10.5)

## 2022-07-07 LAB — COMPREHENSIVE METABOLIC PANEL
ALT: 31 U/L (ref 0–53)
AST: 16 U/L (ref 0–37)
Albumin: 4.4 g/dL (ref 3.5–5.2)
Alkaline Phosphatase: 67 U/L (ref 39–117)
BUN: 18 mg/dL (ref 6–23)
CO2: 26 mEq/L (ref 19–32)
Calcium: 9.6 mg/dL (ref 8.4–10.5)
Chloride: 101 mEq/L (ref 96–112)
Creatinine, Ser: 0.92 mg/dL (ref 0.40–1.50)
GFR: 106.49 mL/min (ref >60.00)
Glucose, Bld: 99 mg/dL (ref 70–99)
Potassium: 4.3 mEq/L (ref 3.5–5.1)
Sodium: 137 mEq/L (ref 135–145)
Total Bilirubin: 0.6 mg/dL (ref 0.2–1.2)
Total Protein: 7.8 g/dL (ref 6.0–8.3)

## 2022-07-07 LAB — VITAMIN D 25 HYDROXY (VIT D DEFICIENCY, FRACTURES): VITD: 46.6 ng/mL (ref 30.00–100.00)

## 2022-07-07 NOTE — Telephone Encounter (Signed)
Lab orders in epic. Patient notified via [place].

## 2022-07-11 LAB — QUANTIFERON-TB GOLD PLUS
Mitogen-NIL: >10 IU/mL
NIL: 0.03 IU/mL
QuantiFERON-TB Gold Plus: NEGATIVE
TB1-NIL: 0.01 IU/mL
TB2-NIL: 0.02 IU/mL

## 2022-07-13 ENCOUNTER — Other Ambulatory Visit: Payer: BC Managed Care – PPO

## 2022-07-13 ENCOUNTER — Ambulatory Visit: Payer: BC Managed Care – PPO | Admitting: Gastroenterology

## 2022-07-13 ENCOUNTER — Encounter: Payer: Self-pay | Admitting: Gastroenterology

## 2022-07-13 ENCOUNTER — Ambulatory Visit (INDEPENDENT_AMBULATORY_CARE_PROVIDER_SITE_OTHER)
Admission: RE | Admit: 2022-07-13 | Discharge: 2022-07-13 | Disposition: A | Discharge status: Home / Self Care | Payer: BC Managed Care – PPO | Source: Ambulatory Visit | Attending: Gastroenterology | Admitting: Gastroenterology

## 2022-07-13 VITALS — BP 100/60 | HR 90 | Ht 74.0 in | Wt 196.0 lb

## 2022-07-13 DIAGNOSIS — R059 Cough, unspecified: Secondary | ICD-10-CM

## 2022-07-13 DIAGNOSIS — J3489 Other specified disorders of nose and nasal sinuses: Secondary | ICD-10-CM

## 2022-07-13 DIAGNOSIS — K50818 Crohn's disease of both small and large intestine with other complication: Secondary | ICD-10-CM | POA: Diagnosis not present

## 2022-07-13 DIAGNOSIS — Z79899 Other long term (current) drug therapy: Secondary | ICD-10-CM

## 2022-07-13 DIAGNOSIS — R079 Chest pain, unspecified: Secondary | ICD-10-CM

## 2022-07-13 NOTE — Progress Notes (Signed)
HPI :  Crohn's disease history: Ileal-colonic Crohn's disease.  He was diagnosed several years ago, remotely on thiopurines for a period of time but not on therapy for several years.  Was admitted in 2020 with an obstruction at the IC valve. Follow up colonoscopy with me March 2020 showed a distorted IC valve / stenosis at the ileum which could not be traversed. He was referred to Renelda Mom at [facility] who performed ileocecectomy in April 2020. Surgery went quite well and his obstructive symptoms resolved. Path from his operative report showed chronic active inflammation but no pre-cancerous changes. Historically he has wanted to avoid biologic or medical therapy for his Crohn's disease so we monitored post operatively.  He underwent a colonoscopy with me on July 02 2020. He had active inflammation at the anastomosis and ileum, Rutgeert's score i2.  Unfortunately through his colon he also had numerous erosions concerning for mild colonic activity.  Biopsies confirmed Crohn's colitis in these areas as well.  Initially placed on Humira + thiopurine. 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  Has been on Humira monotherapy since then.   SINCE LAST VISIT   36 year old male here for follow-up visit for his Crohn's disease.  I last saw him in the office over a year ago, but was seen for his colonoscopy in September.  He remains on Humira every other week monotherapy.  He had a colonoscopy with me in September which generally look pretty good.  His anastomosis was normal, no inflammation there or in his small bowel.  His colon was normal with exception of some very mild inflammation of his rectum.  Biopsies showed some active colitis there.  Given he is asymptomatic and disease was so mild, we have continued his regimen.  Generally he states he has been doing really well in regards to his Crohn's disease and has had no flares or concerns since have last seen him.  He got the Shingrix  vaccine since have seen him over the past year, he also got the PPSV23 shot at our last visit.  His vaccines are up-to-date.  The main issue he has had has been with problems with his sinus and cough.  About 2 months now he has been dealing with a productive cough and has had a sinus infection.  He was treated with a course of Augmentin and states this eventually worked and he felt better for about a few days or so and then symptoms have since come back.  He has pressure in his sinuses and more so has had a persistent cough.  He endorses some low reticulocyte pain on his right chest more recently.  Thinks he may have felt feverish in recent days.  His last dose of Humira was about 10 days ago.  He denies any shortness of breath or chest pain otherwise.  He has no history of blood clots.  He has been seen by ENT in the past and is due to see them tomorrow.    IBD Health Care Maintenance: Annual Flu Vaccine - Date UTD Pneumococcal Vaccine - PCV13 done 07/14/20, PPSSV 23 done Sept 2022 Zoster vaccine  done 2022/2023 TB testing if on anti-TNF, - Date 06/2022 Vitamin D screening - UTD Last Colonoscopy - Date 72021 COVID VACCINE x 2 + Booster   Colonoscopy 03/01/19 - The perianal and digital rectal examinations were normal. - The IC valve was deformed and scarred down from Crohn's disease. A ? fistulous tract was noted just inferior  to the valve. A benign-appearing, intrinsic severe stenosis was found at the ileocecal valve. The lumen was only a few mm wide and could not be traversed. No obvious inflammatory change was noted. Biopsies were taken with a cold forceps for histology. - Polypoid lesions were found in the cecum and at the appendiceal orifice, most consistent with benign pseudoinflammatory polyps. Biopsies were taken with a cold forceps for histology. - Scattered medium-mouthed diverticula were found in the entire colon. - Internal hemorrhoids were found during retroflexion. The hemorrhoids  were small. - The exam was otherwise without abnormality. No colonic inflammation   Surgery 04/12/19 - ileocecectomy - Dr. Renelda Mom Path shows chronic active inflammation, with polypoid lesion in the cecum removed - no evidence of atypical lymphoid proliferation   CT scan 06/26/19 - s/p ileocecectomy, multiple benign appearing lymph nodes in RLQ, benign small pulmonary nodule   Colonoscopy 07/02/20 - The perianal and digital rectal examinations were normal. - There was evidence of a prior end-to-side ileo-colonic anastomosis in the ascending colon. This was patent and was characterized by a few apthous ulcerations but was widely patent. - Patchy inflammation, graded as Rutgeerts Score i2 (more than five aphthous lesions with normal intervening mucosa or skip areas of larger lesions or lesions confined to the ileocolonic anastomosis) and characterized by erosions and aphthous ulcerations was found in the terminal ileum. - A few isolated erosions were found in the descending colon, in the transverse colon and in the ascending colon. Biopsies were taken with a cold forceps for histology. - Scattered small-mouthed diverticula were found in the transverse colon and left colon. - Internal hemorrhoids were found during retroflexion. - The exam was otherwise without abnormality.   Diagnosis Surgical [P], colon - CHRONIC ACTIVE COLITIS - NO GRANULOMATA, DYSPLASIA OR MALIGNANCY IDENTIFIED - SEE COMMENT   Colonoscopy 08/20/21: The perianal and digital rectal examinations were normal. - The terminal ileum appeared normal. - There was evidence of a prior end-to-end ileo-colonic anastomosis in the ascending colon. This was patent and was characterized by healthy appearing mucosa. The anastomosis was traversed. - Scattered medium-mouthed diverticula were found in the transverse colon and left colon. - A diminutive polyp was found in the sigmoid colon. The polyp was sessile. The polyp was removed  with a cold biopsy forceps. Resection and retrieval were complete. - Internal hemorrhoids were found during retroflexion. - An area of mildly altered mucosa was found in the distal rectum, possible very mildly active colitis. Biopsies were taken with a cold forceps for histology. - The exam was otherwise without abnormality. - Biopsies were taken with a cold forceps in the sigmoid colon, in the descending colon, in the transverse colon and in the ascending colon for histology.  1. Surgical [P], right colon bx - BENIGN COLONIC MUCOSA - NO ACTIVE INFLAMMATION OR EVIDENCE OF MICROSCOPIC COLITIS - NO HIGH GRADE DYSPLASIA OR MALIGNANCY IDENTIFIED 2. Surgical [P], transverse colon bx - BENIGN COLONIC MUCOSA - NO ACTIVE INFLAMMATION OR EVIDENCE OF MICROSCOPIC COLITIS - NO HIGH GRADE DYSPLASIA OR MALIGNANCY IDENTIFIED 3. Surgical [P], left colon bx - BENIGN COLONIC MUCOSA - NO ACTIVE INFLAMMATION OR EVIDENCE OF MICROSCOPIC COLITIS - NO HIGH GRADE DYSPLASIA OR MALIGNANCY IDENTIFIED 4. Surgical [P], colon, rectum bx - CHRONIC FOCALLY ACTIVE COLITIS - NO GRANULOMATA, DYSPLASIA OR MALIGNANCY IDENTIFIED 5. Surgical [P], colon, sigmoid polyp x1, polyp (1) - BENIGN COLONIC MUCOSA WITH UNDERLYING LYMPHOID AGGREGATE (1 OF 1 FRAGMENTS) - NO HIGH-GRADE DYSPLASIA OR MALIGNANCY IDENTIFIED - SEE COMMENT  Past Medical History:  Diagnosis Date   Chest pain    negative stress test   Contusion of rib on left side    Corneal abrasion    non-healing, wore a permanent contact and finally removed yesterday   Crohn's disease ([place])    ileal disease s/p ileocecectomy    RUQ pain    Sinusitis    Urticaria      Past Surgical History:  Procedure Laterality Date   COLONOSCOPY  08/20/2021   EYE SURGERY     KNEE SURGERY     VASECTOMY     Family History  Problem Relation Age of Onset   Crohn's disease Father    Other Other        HEALTHY   Other Other        HEALTHY   Other Other        HEALTHY    Colon cancer Neg Hx    Esophageal cancer Neg Hx    Stomach cancer Neg Hx    Social History   Tobacco Use   Smoking status: Former    Packs/day: 1.00    Years: 12.00    Total pack years: 12.00    Types: Cigarettes   Smokeless tobacco: Never  Vaping Use   Vaping Use: Former   Devices: trying to quit   Substance Use Topics   Alcohol use: Yes    Comment: occasionally   Drug use: No   Current Outpatient Medications  Medication Sig Dispense Refill   carboxymethylcellulose (REFRESH PLUS) 0.5 % SOLN Place 1 drop into the left eye 3 (three) times daily as needed.     ELDERBERRY PO Take 1 Capful by mouth daily.     finasteride (PROPECIA) 1 MG tablet Take 1 mg by mouth daily. Hair loss     fluticasone (FLONASE) 50 MCG/ACT nasal spray Place into the nose.     Multiple Vitamins-Minerals (MULTIVITAMIN WITH MINERALS) tablet Take 1 tablet by mouth daily.     sodium chloride (MURO 128) 5 % ophthalmic ointment Place 1 application into the left eye at bedtime.     HUMIRA PEN 40 MG/0.4ML PNKT INJECT 1 PEN UNDER THE SKIN EVERY 14 DAYS. 2 each 6   No current facility-administered medications for this visit.   Allergies  Allergen Reactions   Milk-Related Compounds    Other Other (See Comments)    Lactose intolerant but able to consume cheese. Avoids milk, ice cream, pudding. MM, RD 04/15/19 Lactose intolerant but able to consume cheese. Avoids milk, ice cream, pudding. MM, RD 04/15/19      Review of Systems: All systems reviewed and negative except where noted in HPI.   Lab Results  Component Value Date   WBC 9.6 07/07/2022   HGB 14.1 07/07/2022   HCT 43.3 07/07/2022   MCV 80.6 07/07/2022   PLT 274.0 07/07/2022   Lab Results  Component Value Date   CREATININE 0.92 07/07/2022   BUN 18 07/07/2022   NA 137 07/07/2022   K 4.3 07/07/2022   CL 101 07/07/2022   CO2 26 07/07/2022    Lab Results  Component Value Date   ALT 31 07/07/2022   AST 16 07/07/2022   ALKPHOS 67 07/07/2022    BILITOT 0.6 07/07/2022     Physical Exam: BP 100/60   Pulse 90   Ht 6' 2"  (1.88 m)   Wt 196 lb (88.9 kg)   SpO2 95%   BMI 25.16 kg/m  Constitutional: Pleasant,well-developed, male in no acute  distress.  Cardiovascular: Normal rate, regular rhythm.  Pulmonary/chest: Effort normal but rhoncorous breath sounds bilaterally, worse on right base, mild wheezing on right base.  Neurological: Alert and oriented to person place and time. Psychiatric: Normal mood and affect. Behavior is normal.   ASSESSMENT AND PLAN: 36 year old male here for reassessment of following:  Crohn's disease High risk medication use Cough Sinus pain  As above, Crohn's disease has been pretty well controlled on Humira monotherapy now for some time.  Most recent colonoscopy in September showed no inflammation at the anastomosis which was patent, very mild rectal inflammation not causing symptoms.  We discussed his options and course moving forward.  He will remain on Humira for now however if he has flares or persistent active disease over time can consider change in his management with newer options available.  I asked him to go to the lab for fecal calprotectin at some point in the next few months when he is feeling okay.  Crohn's is not bothering him right now at all and he wants to continue his therapy.  I agree with this, however in light of his infectious symptoms I recommend holding his next dose of Humira and may need to hold for a few doses pending his course with his cough and sinus issues.  He has some rhonchorous breath sounds in his right side today, I will obtain a chest x-ray PA lateral to make sure he has not developed a pneumonia.  I anticipate starting antibiotics for possible sinusitis as well, will await his chest x-ray today and get back to him.  Again he will hold his Humira.  Vaccines otherwise up-to-date.  He is seeing ENT tomorrow.  He will keep me posted on his status and we will make a  decision on when to resume his Humira pending his course in the next few weeks.  Jolly Mango, MD [facility]

## 2022-07-13 NOTE — Patient Instructions (Addendum)
If you are age 37 or older, your body mass index should be between 23-30. Your Body mass index is 25.16 kg/m. If this is out of the aforementioned range listed, please consider follow up with your Primary Care Provider.  If you are age 28 or younger, your body mass index should be between 19-25. Your Body mass index is 25.16 kg/m. If this is out of the aformentioned range listed, please consider follow up with your Primary Care Provider.   ________________________________________________________  The [facility] providers would like to encourage you to use [facility] to communicate with providers for non-urgent requests or questions.  Due to long hold times on the telephone, sending your provider a message by [facility] may be a faster and more efficient way to get a response.  Please allow 48 business hours for a response.  Please remember that this is for non-urgent requests.  _______________________________________________________  Please hold your Humira for now, given your symptoms.  Your provider has requested that you have an chest x ray before leaving today. Please go to the basement floor to our Radiology department for the test.   Please go to the lab in the basement of our building to have lab work done as you leave today. Hit "B" for basement when you get on the elevator.  When the doors open the lab is on your left.  We will call you with the results. Thank you.  Thank you for entrusting me with your care and for choosing [facility], Dr. [place] Cellar

## 2022-07-14 ENCOUNTER — Encounter: Payer: Self-pay | Admitting: Gastroenterology

## 2022-07-14 DIAGNOSIS — J343 Hypertrophy of nasal turbinates: Secondary | ICD-10-CM | POA: Diagnosis not present

## 2022-07-14 DIAGNOSIS — J342 Deviated nasal septum: Secondary | ICD-10-CM | POA: Diagnosis not present

## 2022-07-14 DIAGNOSIS — J0120 Acute ethmoidal sinusitis, unspecified: Secondary | ICD-10-CM | POA: Diagnosis not present

## 2022-07-27 DIAGNOSIS — J310 Chronic rhinitis: Secondary | ICD-10-CM | POA: Diagnosis not present

## 2022-07-27 DIAGNOSIS — J329 Chronic sinusitis, unspecified: Secondary | ICD-10-CM | POA: Diagnosis not present

## 2022-08-11 DIAGNOSIS — J343 Hypertrophy of nasal turbinates: Secondary | ICD-10-CM | POA: Diagnosis not present

## 2022-08-11 DIAGNOSIS — J0120 Acute ethmoidal sinusitis, unspecified: Secondary | ICD-10-CM | POA: Diagnosis not present

## 2022-08-11 DIAGNOSIS — J342 Deviated nasal septum: Secondary | ICD-10-CM | POA: Diagnosis not present

## 2022-08-18 ENCOUNTER — Encounter: Payer: Self-pay | Admitting: Gastroenterology

## 2022-08-19 ENCOUNTER — Other Ambulatory Visit: Payer: Self-pay

## 2022-08-19 DIAGNOSIS — R059 Cough, unspecified: Secondary | ICD-10-CM

## 2022-08-19 DIAGNOSIS — R053 Chronic cough: Secondary | ICD-10-CM

## 2022-08-23 DIAGNOSIS — J324 Chronic pansinusitis: Secondary | ICD-10-CM | POA: Diagnosis not present

## 2022-08-23 DIAGNOSIS — J343 Hypertrophy of nasal turbinates: Secondary | ICD-10-CM | POA: Diagnosis not present

## 2022-08-23 DIAGNOSIS — J342 Deviated nasal septum: Secondary | ICD-10-CM | POA: Diagnosis not present

## 2022-08-23 DIAGNOSIS — J310 Chronic rhinitis: Secondary | ICD-10-CM | POA: Diagnosis not present

## 2022-08-25 ENCOUNTER — Other Ambulatory Visit: Payer: BC Managed Care – PPO

## 2022-08-25 DIAGNOSIS — K50818 Crohn's disease of both small and large intestine with other complication: Secondary | ICD-10-CM

## 2022-08-25 DIAGNOSIS — R059 Cough, unspecified: Secondary | ICD-10-CM

## 2022-08-25 DIAGNOSIS — Z79899 Other long term (current) drug therapy: Secondary | ICD-10-CM

## 2022-08-25 DIAGNOSIS — J3489 Other specified disorders of nose and nasal sinuses: Secondary | ICD-10-CM | POA: Diagnosis not present

## 2022-08-29 DIAGNOSIS — H18833 Recurrent erosion of cornea, bilateral: Secondary | ICD-10-CM | POA: Diagnosis not present

## 2022-08-29 DIAGNOSIS — H18523 Epithelial (juvenile) corneal dystrophy, bilateral: Secondary | ICD-10-CM | POA: Diagnosis not present

## 2022-08-29 LAB — CALPROTECTIN, FECAL: Calprotectin, Fecal: 84 ug/g (ref 0–120)

## 2022-09-15 ENCOUNTER — Other Ambulatory Visit: Payer: Self-pay | Admitting: Otolaryngology

## 2022-09-15 DIAGNOSIS — J324 Chronic pansinusitis: Secondary | ICD-10-CM | POA: Diagnosis not present

## 2022-09-15 DIAGNOSIS — J343 Hypertrophy of nasal turbinates: Secondary | ICD-10-CM | POA: Diagnosis not present

## 2022-09-15 DIAGNOSIS — J0120 Acute ethmoidal sinusitis, unspecified: Secondary | ICD-10-CM | POA: Diagnosis not present

## 2022-09-15 DIAGNOSIS — J329 Chronic sinusitis, unspecified: Secondary | ICD-10-CM | POA: Diagnosis not present

## 2022-09-15 DIAGNOSIS — J342 Deviated nasal septum: Secondary | ICD-10-CM | POA: Diagnosis not present

## 2022-09-23 ENCOUNTER — Institutional Professional Consult (permissible substitution): Payer: BC Managed Care – PPO | Admitting: Internal Medicine

## 2022-09-26 DIAGNOSIS — F4323 Adjustment disorder with mixed anxiety and depressed mood: Secondary | ICD-10-CM | POA: Diagnosis not present

## 2022-09-27 ENCOUNTER — Institutional Professional Consult (permissible substitution): Payer: BC Managed Care – PPO | Admitting: Pulmonary Disease

## 2022-09-27 ENCOUNTER — Ambulatory Visit: Payer: BC Managed Care – PPO | Admitting: Pulmonary Disease

## 2022-09-27 ENCOUNTER — Encounter: Payer: Self-pay | Admitting: Pulmonary Disease

## 2022-09-27 VITALS — BP 112/74 | HR 74 | Temp 98.4°F | Ht 74.0 in | Wt 195.2 lb

## 2022-09-27 DIAGNOSIS — R052 Subacute cough: Secondary | ICD-10-CM

## 2022-09-27 NOTE — Progress Notes (Signed)
James Ortiz    [id number]    08-Sep-1986  Primary Care Physician:Patient, No Pcp Per  Referring Physician: Yetta Flock, MD [address] [place],  [place] [address]  Chief complaint:   Cough  HPI:  Nasal stuffiness, congestion, cough Recently had nasal surgery, had deviated septum addressed  6 months history of cough, sinus congestion  Has yearly sinus congestion/fullness  Recently treated with a course of antibiotics and steroids Placed on Singulair which has helped symptoms  Recent chest x-ray shows no significant abnormality  Office environment Quit smoking about 5 years ago, half a pack a day  Exercises regularly, able to tolerate 30 to 45 minutes on the rower  Outpatient Encounter Medications as of 09/27/2022  Medication Sig   montelukast (SINGULAIR) 10 MG tablet Take 1 tablet by mouth daily.   albuterol (VENTOLIN HFA) 108 (90 Base) MCG/ACT inhaler Inhale 1 puff into the lungs as needed.   carboxymethylcellulose (REFRESH PLUS) 0.5 % SOLN Place 1 drop into the left eye 3 (three) times daily as needed.   ELDERBERRY PO Take 1 Capful by mouth daily.   finasteride (PROPECIA) 1 MG tablet Take 1 mg by mouth daily. Hair loss   fluticasone (FLONASE) 50 MCG/ACT nasal spray Place into the nose.   HUMIRA PEN 40 MG/0.4ML PNKT INJECT 1 PEN UNDER THE SKIN EVERY 14 DAYS.   Multiple Vitamins-Minerals (MULTIVITAMIN WITH MINERALS) tablet Take 1 tablet by mouth daily.   sodium chloride (MURO 128) 5 % ophthalmic ointment Place 1 application into the left eye at bedtime.   No facility-administered encounter medications on file as of 09/27/2022.    Allergies as of 09/27/2022 - Review Complete 09/27/2022  Allergen Reaction Noted   Milk-related compounds     Other Other (See Comments) 04/15/2019    Past Medical History:  Diagnosis Date   Chest pain    negative stress test   Contusion of rib on left side    Corneal abrasion    non-healing, wore a  permanent contact and finally removed yesterday   Crohn's disease ([place])    ileal disease s/p ileocecectomy    RUQ pain    Sinusitis    Urticaria     Past Surgical History:  Procedure Laterality Date   COLONOSCOPY  08/20/2021   EYE SURGERY     KNEE SURGERY     VASECTOMY      Family History  Problem Relation Age of Onset   Crohn's disease Father    Other Other        HEALTHY   Other Other        HEALTHY   Other Other        HEALTHY   Colon cancer Neg Hx    Esophageal cancer Neg Hx    Stomach cancer Neg Hx     Social History   Socioeconomic History   Marital status: Married    Spouse name: Not on file   Number of children: Not on file   Years of education: Not on file   Highest education level: Not on file  Occupational History   Occupation: Optometrist  Tobacco Use   Smoking status: Former    Packs/day: 1.00    Years: 12.00    Total pack years: 12.00    Types: Cigarettes   Smokeless tobacco: Never  Vaping Use   Vaping Use: Former   Devices: trying to quit   Substance and Sexual  Activity   Alcohol use: Yes    Comment: occasionally   Drug use: No   Sexual activity: Yes  Other Topics Concern   Not on file  Social History Narrative   Not on file   Social Determinants of Health   Financial Resource Strain: Not on file  Food Insecurity: Not on file  Transportation Needs: Not on file  Physical Activity: Not on file  Stress: Not on file  Social Connections: Not on file  Intimate Partner Violence: Not on file    Review of Systems  Respiratory:  Positive for cough.     Vitals:   09/27/22 1005  BP: 112/74  Pulse: 74  Temp: 98.4 F (36.9 C)  SpO2: 100%     Physical Exam Constitutional:      Appearance: Normal appearance.  HENT:     Head: Normocephalic.     Nose: Nose normal.     Mouth/Throat:     Mouth: Mucous membranes are moist.  Cardiovascular:     Rate and Rhythm: Normal rate and regular rhythm.  Pulmonary:     Effort: No  respiratory distress.     Breath sounds: No stridor. No wheezing or rhonchi.  Musculoskeletal:     Cervical back: No rigidity or tenderness.  Neurological:     Mental Status: He is alert.  Psychiatric:        Mood and Affect: Mood normal.    Data Reviewed: Recent chest x-ray with no infiltrate  Assessment:  Recurrent cough -Has no history of underlying lung disease -Social smoking in the past quit over 5 years ago -No history of known allergies  Nasal stuffiness/congestion, deviated septum -Recently had surgery  Currently on Singulair which seems to be helping    Plan/Recommendations: May consider pulmonary function test Consider repeat chest x-ray if persistence of symptoms  Continue Singulair at present, may consider stopping Singulair once nasal symptoms have fully recovered  Pay attention to triggers  Tentative follow-up in about 6 months   Sherrilyn Rist MD [facility] 09/27/2022, 10:20 AM  CC: Armbruster, Carlota Raspberry, MD

## 2022-09-27 NOTE — Patient Instructions (Signed)
I will see you back in about 6 months  Continue montelukast You may consider stopping the montelukast once all nasal symptoms have resolved  Monitor for any triggers  Inhaler use only as needed   We can consider a repeat chest x-ray, a breathing study if early recurrence of symptoms or you notice progression of symptoms  Call with significant concerns

## 2022-10-07 DIAGNOSIS — F4323 Adjustment disorder with mixed anxiety and depressed mood: Secondary | ICD-10-CM | POA: Diagnosis not present

## 2022-10-17 DIAGNOSIS — F4323 Adjustment disorder with mixed anxiety and depressed mood: Secondary | ICD-10-CM | POA: Diagnosis not present

## 2022-11-04 DIAGNOSIS — F4323 Adjustment disorder with mixed anxiety and depressed mood: Secondary | ICD-10-CM | POA: Diagnosis not present

## 2022-11-29 ENCOUNTER — Encounter: Payer: Self-pay | Admitting: Gastroenterology

## 2022-11-29 DIAGNOSIS — Z79899 Other long term (current) drug therapy: Secondary | ICD-10-CM

## 2022-11-29 DIAGNOSIS — K50818 Crohn's disease of both small and large intestine with other complication: Secondary | ICD-10-CM

## 2022-12-02 ENCOUNTER — Other Ambulatory Visit: Payer: Self-pay

## 2022-12-02 DIAGNOSIS — R197 Diarrhea, unspecified: Secondary | ICD-10-CM

## 2022-12-02 NOTE — Telephone Encounter (Signed)
Order placed & left message for patient to call back. It appears he has already read mychart & is aware that Dr. Enis Gash would like him to come in today for stool sample.

## 2022-12-02 NOTE — Telephone Encounter (Signed)
Spoke with patient & he doesn't think he will be able to make it by our office today, but will plan on coming in on Monday if symptoms persist. Advised him on when/where to go for stool kit. 

## 2022-12-07 ENCOUNTER — Other Ambulatory Visit: Payer: BC Managed Care – PPO

## 2022-12-07 DIAGNOSIS — R197 Diarrhea, unspecified: Secondary | ICD-10-CM

## 2022-12-07 DIAGNOSIS — A0811 Acute gastroenteropathy due to Norwalk agent: Secondary | ICD-10-CM | POA: Diagnosis not present

## 2022-12-07 DIAGNOSIS — A09 Infectious gastroenteritis and colitis, unspecified: Secondary | ICD-10-CM | POA: Diagnosis not present

## 2022-12-11 LAB — GI PROFILE, STOOL, PCR

## 2022-12-13 ENCOUNTER — Telehealth: Payer: Self-pay | Admitting: Gastroenterology

## 2022-12-13 NOTE — Telephone Encounter (Signed)
[facility] called to leave an Alert on [employer]. Please advise.

## 2022-12-13 NOTE — Telephone Encounter (Signed)
Danis pt, [facility] called regarding GI path panel. Positive for sapovirus. As DOD please advise.

## 2022-12-13 NOTE — Telephone Encounter (Signed)
Armbruster patient I think....    GI path panel + sapovirus which is a self-limited virus which causes gastroenteritis  No specific treatment other than hydration and rest, BRAT type diet Related to norovirus  Please notify and check on patient

## 2022-12-13 NOTE — Telephone Encounter (Signed)
Armbruster pt-Spoke with pt and he states he has been having symptoms for 18 days. Reports it started out as liquid diarrhea about every 30 min. Now he states he is having urgency with loose stool about 5 times a day. He wants to know when the symptoms may go away. Please advise as DOD.

## 2022-12-13 NOTE — Telephone Encounter (Signed)
He should not have a viral enteritis for this long; in the absence of a Crohn's associated inflammation or viral infection and this would not be with Sapovirus I can see from prior notes that he tried Lomotil He likely needs further evaluation to assess disease activity for Crohn's disease In the interim he can use Lomotil 1 to 2 tablets 4 times daily as needed and try cholestyramine 4 g nightly Dr. Havery Moros can weigh in about additional questions on his return Continue liberal hydration

## 2022-12-14 MED ORDER — CHOLESTYRAMINE 4 G PO PACK: 4.0000 g | PACK | Freq: Every evening | ORAL | 0 refills | Status: DC

## 2022-12-14 NOTE — Telephone Encounter (Signed)
MyChart message sent to patient to send message with an update next week.

## 2022-12-14 NOTE — Telephone Encounter (Signed)
Called and spoke with patient regarding additional recommendations. Pt will take Lomotil 1-2 tabs QID PRN and will take Cholestyramine at bedtime. Pt has been advised to stay hydrated. Pt is aware that this information has been sent to Dr. Havery Moros to review. Pt has been informed that Dr. Havery Moros is out of the office this week and we will contact him with any additional recommendations upon his return. Pt verbalized understanding and had no concerns at the end of the call.  RX for Cholestyramine sent to [facility] on file per pt request.

## 2022-12-14 NOTE — Telephone Encounter (Signed)
Thanks Ulice Dash.  Odd that this has persisted so long post viral infection, not sure if post infectious functional change, his Crohn's has been fairly well controlled. Agree with trial of cholestyramine and hopefully with some supportive measures / time this resolves  No other changes right now. [place] can you ask him to message me next week with an update on the new regimen? Thanks

## 2022-12-21 ENCOUNTER — Other Ambulatory Visit: Payer: BC Managed Care – PPO

## 2022-12-21 DIAGNOSIS — K50818 Crohn's disease of both small and large intestine with other complication: Secondary | ICD-10-CM | POA: Diagnosis not present

## 2022-12-21 DIAGNOSIS — Z79899 Other long term (current) drug therapy: Secondary | ICD-10-CM | POA: Diagnosis not present

## 2022-12-21 NOTE — Addendum Note (Signed)
Addended by: Yevette Edwards on: 12/21/2022 08:19 AM   Modules accepted: Orders

## 2022-12-21 NOTE — Telephone Encounter (Signed)
Fecal calprotectin order in epic. 

## 2022-12-24 LAB — CALPROTECTIN, FECAL: Calprotectin, Fecal: 105 ug/g (ref 0–120)

## 2023-01-11 ENCOUNTER — Telehealth: Payer: Self-pay

## 2023-01-11 MED ORDER — HUMIRA (2 PEN) 40 MG/0.4ML ~~LOC~~ AJKT: AUTO-INJECTOR | SUBCUTANEOUS | 6 refills | Status: DC

## 2023-01-11 NOTE — Telephone Encounter (Signed)
Received a Humira refill request from [facility]. Refill has been sent electronically.  Called and spoke with patient to follow up and see how he was feeling since last month. Pt states that he is feeling much better at this time and has not experienced any diarrhea. Pt has continued his Humira on schedule. Pt had no concerns at the end of the call.

## 2023-01-23 DIAGNOSIS — Z9889 Other specified postprocedural states: Secondary | ICD-10-CM | POA: Diagnosis not present

## 2023-01-23 DIAGNOSIS — H18833 Recurrent erosion of cornea, bilateral: Secondary | ICD-10-CM | POA: Diagnosis not present

## 2023-01-23 DIAGNOSIS — H18522 Epithelial (juvenile) corneal dystrophy, left eye: Secondary | ICD-10-CM | POA: Diagnosis not present

## 2023-02-24 DIAGNOSIS — H02829 Cysts of unspecified eye, unspecified eyelid: Secondary | ICD-10-CM | POA: Diagnosis not present

## 2023-02-24 DIAGNOSIS — Z9889 Other specified postprocedural states: Secondary | ICD-10-CM | POA: Diagnosis not present

## 2023-02-24 DIAGNOSIS — H18833 Recurrent erosion of cornea, bilateral: Secondary | ICD-10-CM | POA: Diagnosis not present

## 2023-03-29 DIAGNOSIS — M25511 Pain in right shoulder: Secondary | ICD-10-CM | POA: Diagnosis not present

## 2023-03-31 ENCOUNTER — Encounter: Payer: Self-pay | Admitting: Pulmonary Disease

## 2023-03-31 MED ORDER — MONTELUKAST SODIUM 10 MG PO TABS: 10.0000 mg | ORAL_TABLET | Freq: Every day | ORAL | 4 refills | Status: DC

## 2023-04-07 DIAGNOSIS — M25511 Pain in right shoulder: Secondary | ICD-10-CM | POA: Diagnosis not present

## 2023-04-17 DIAGNOSIS — M25511 Pain in right shoulder: Secondary | ICD-10-CM | POA: Diagnosis not present

## 2023-05-16 ENCOUNTER — Other Ambulatory Visit (HOSPITAL_COMMUNITY): Payer: Self-pay

## 2023-05-16 ENCOUNTER — Telehealth: Payer: Self-pay | Admitting: Pharmacy Technician

## 2023-05-16 NOTE — Telephone Encounter (Signed)
Patient Advocate Encounter  Prior Authorization for HUMIRA 40MG  has been approved with [employer].    PA# [id number] Effective dates: 5.28.24 through 5.28.25  Per [facility] test claim,  MUST BE FILLED AT SPECIALTY PHARMACY CALL [phone] PRODUCT/SERVICE NOT APPROPRIATE FOR THIS LOCATION MANUFACTURER COPAY CARD ELIGIBLE

## 2023-05-16 NOTE — Telephone Encounter (Signed)
Patient Advocate Encounter  Received notification from [employer] that prior authorization for HUMIRA 40 is required.   PA submitted on 5.28.24 Key [id number] Status is pending

## 2023-06-05 DIAGNOSIS — H02829 Cysts of unspecified eye, unspecified eyelid: Secondary | ICD-10-CM | POA: Diagnosis not present

## 2023-06-05 DIAGNOSIS — H18833 Recurrent erosion of cornea, bilateral: Secondary | ICD-10-CM | POA: Diagnosis not present

## 2023-06-05 DIAGNOSIS — Z9889 Other specified postprocedural states: Secondary | ICD-10-CM | POA: Diagnosis not present

## 2023-06-05 DIAGNOSIS — H02821 Cysts of right upper eyelid: Secondary | ICD-10-CM | POA: Diagnosis not present

## 2023-06-05 DIAGNOSIS — H02822 Cysts of right lower eyelid: Secondary | ICD-10-CM | POA: Diagnosis not present

## 2023-06-14 ENCOUNTER — Encounter: Payer: Self-pay | Admitting: Gastroenterology

## 2023-07-13 ENCOUNTER — Encounter: Payer: Self-pay | Admitting: Adult Health

## 2023-07-13 ENCOUNTER — Ambulatory Visit: Payer: BC Managed Care – PPO | Admitting: Adult Health

## 2023-07-13 VITALS — BP 102/74 | HR 73 | Ht 74.0 in | Wt 196.4 lb

## 2023-07-13 DIAGNOSIS — J310 Chronic rhinitis: Secondary | ICD-10-CM | POA: Diagnosis not present

## 2023-07-13 DIAGNOSIS — R0683 Snoring: Secondary | ICD-10-CM | POA: Diagnosis not present

## 2023-07-13 NOTE — Assessment & Plan Note (Signed)
History of loud snoring and witnessed apneic event.  Suspicious for underlying sleep apnea.  Discussion regarding potential complications of untreated sleep apnea.  Patient education was given.  Will set patient up for home sleep study.  Advised on positional sleep techniques such as avoiding sleeping supinely.  Keeping head of bed elevated at 30 degrees.  Plan Patient Instructions  Set up for home sleep study  Saline nasal spray Twice daily   Saline nasal gel At bedtime   Continue on Singulair 10mg  At bedtime   Flonase  As needed   Claritin 10mg  At bedtime  As needed   Follow up with Dr. Wynona Neat in 6 weeks and As needed

## 2023-07-13 NOTE — Patient Instructions (Addendum)
Set up for home sleep study  Saline nasal spray Twice daily   Saline nasal gel At bedtime   Continue on Singulair 10mg  At bedtime   Flonase  As needed   Claritin 10mg  At bedtime  As needed   Follow up with Dr. Wynona Neat in 6 weeks and As needed

## 2023-07-13 NOTE — Progress Notes (Signed)
@Patient  ID: James Ortiz, male    DOB: 1986/10/08, 37 y.o.   MRN: [id number]  Chief Complaint  Patient presents with   Follow-up    Referring provider: No ref. provider found  HPI: 37 year old male former smoker seen for pulmonary consult September 27, 2022 for chronic cough Medical history significant for Crohn's disease, chronic rhinitis/sinusitis and deviated septum surgery   TEST/EVENTS :  Chest x-ray July 13, 2022 shows clear lungs  07/13/2023 Follow up ; Snoring  Patient returns for follow-up visit.  Last seen October 2023. for pulmonary consult for chronic cough.  Felt to be from possible chronic rhinitis.  Was recommended to continue on Singulair daily.  Use Flonase as needed.  Patient says since last visit he is doing fairly well with cough.  Has a very minimal cough.  Does complain of some postnasal drainage and intermittent stuffiness.  He has restarted Flonase recently.  Feels like he has something constantly in the back of his throat especially at nighttime.  Patient does have a history of chronic rhinitis and sinusitis.  Previous deviated septum surgery in 2023 by ENT.  Says that his sinuses are improved and has had no further sinus infection which has been very good.  Typically goes to bed about 11 PM.  Takes only a few minutes to go to sleep.  Gets up at 7 AM.  Weight has been steady.  Current weight is at 196 pounds with a BMI 25.  Patient did bring in a video today that recorded him with very loud snoring.  Wife also has noticed some episodes of possible apneic events.  Patient says the main reason he has come back today is because he had had significant increase in snoring.  It is disruptive to his wife's sleep.  He feels that he sleeps a little restless.  He does not have any significant daytime sleepiness.  But does drink at least 1-3 caffeinated beverages daily between espresso or energy drink.  Does not use any sleep aids.  Does not nap.  He does have a permanent  retainer.  Epworth score is 1 out of 24.  No history of congestive heart failure or stroke. No symptoms suspicious for cataplexy or sleep paralysis.   Allergies  Allergen Reactions   Milk-Related Compounds    Other Other (See Comments)    Lactose intolerant but able to consume cheese. Avoids milk, ice cream, pudding. MM, RD 04/15/19 Lactose intolerant but able to consume cheese. Avoids milk, ice cream, pudding. MM, RD 04/15/19     Immunization History  Administered Date(s) Administered   Pneumococcal Conjugate-13 07/14/2020   Pneumococcal Polysaccharide-23 05/13/2021    Past Medical History:  Diagnosis Date   Chest pain    negative stress test   Contusion of rib on left side    Corneal abrasion    non-healing, wore a permanent contact and finally removed yesterday   Crohn's disease ([facility])    ileal disease s/p ileocecectomy    RUQ pain    Sinusitis    Urticaria     Tobacco History: Social History   Tobacco Use  Smoking Status Former   Current packs/day: 1.00   Average packs/day: 1 pack/day for 12.0 years (12.0 ttl pk-yrs)   Types: Cigarettes  Smokeless Tobacco Never   Counseling given: Not Answered   Outpatient Medications Prior to Visit  Medication Sig Dispense Refill   Adalimumab (HUMIRA, 2 PEN,) 40 MG/0.4ML PNKT INJECT 1 PEN UNDER THE SKIN EVERY 14 DAYS.  2 each 6   albuterol (VENTOLIN HFA) 108 (90 Base) MCG/ACT inhaler Inhale 1 puff into the lungs as needed.     carboxymethylcellulose (REFRESH PLUS) 0.5 % SOLN Place 1 drop into the left eye 3 (three) times daily as needed.     ELDERBERRY PO Take 1 Capful by mouth daily.     finasteride (PROPECIA) 1 MG tablet Take 1 mg by mouth daily. Hair loss     montelukast (SINGULAIR) 10 MG tablet Take 1 tablet (10 mg total) by mouth daily. 30 tablet 4   Multiple Vitamins-Minerals (MULTIVITAMIN WITH MINERALS) tablet Take 1 tablet by mouth daily.     fluticasone (FLONASE) 50 MCG/ACT nasal spray Place into the nose.      cholestyramine (QUESTRAN) 4 g packet Take 1 packet (4 g total) by mouth at bedtime. (Patient not taking: Reported on 07/13/2023) 30 each 0   sodium chloride (MURO 128) 5 % ophthalmic ointment Place 1 application into the left eye at bedtime. (Patient not taking: Reported on 07/13/2023)     No facility-administered medications prior to visit.     Review of Systems:   Constitutional:   No  weight loss, night sweats,  Fevers, chills, fatigue, or  lassitude.  HEENT:   No headaches,  Difficulty swallowing,  Tooth/dental problems, or  Sore throat,                No sneezing, itching, ear ache,  +nasal congestion, post nasal drip,   CV:  No chest pain,  Orthopnea, PND, swelling in lower extremities, anasarca, dizziness, palpitations, syncope.   GI  No heartburn, indigestion, abdominal pain, nausea, vomiting, diarrhea, change in bowel habits, loss of appetite, bloody stools.   Resp: No shortness of breath with exertion or at rest.  No excess mucus, no productive cough,  No non-productive cough,  No coughing up of blood.  No change in color of mucus.  No wheezing.  No chest wall deformity  Skin: no rash or lesions.  GU: no dysuria, change in color of urine, no urgency or frequency.  No flank pain, no hematuria   MS:  No joint pain or swelling.  No decreased range of motion.  No back pain.    Physical Exam  BP 102/74 (BP Location: Left Arm, Patient Position: Sitting, Cuff Size: Normal)   Pulse 73   Ht 6\' 2"  (1.88 m)   Wt 196 lb 6.4 oz (89.1 kg)   SpO2 96%   BMI 25.22 kg/m   GEN: A/Ox3; pleasant , NAD, well nourished    HEENT:  [place]/AT,  NOSE-clear, THROAT-clear, no lesions, no postnasal drip or exudate noted.  Class II MP airway  NECK:  Supple w/ fair ROM; no JVD; normal carotid impulses w/o bruits; no thyromegaly or nodules palpated; no lymphadenopathy.    RESP  Clear  P & A; w/o, wheezes/ rales/ or rhonchi. no accessory muscle use, no dullness to percussion  CARD:  RRR, no m/r/g,  no peripheral edema, pulses intact, no cyanosis or clubbing.  GI:   Soft & nt; nml bowel sounds; no organomegaly or masses detected.   Musco: Warm bil, no deformities or joint swelling noted.   Neuro: alert, no focal deficits noted.    Skin: Warm, no lesions or rashes    Lab Results:  CBC    BNP No results found for: "BNP"  ProBNP No results found for: "PROBNP"  Imaging: No results found.  Administration History     None  No data to display          No results found for: "NITRICOXIDE"      Assessment & Plan:   Snoring History of loud snoring and witnessed apneic event.  Suspicious for underlying sleep apnea.  Discussion regarding potential complications of untreated sleep apnea.  Patient education was given.  Will set patient up for home sleep study.  Advised on positional sleep techniques such as avoiding sleeping supinely.  Keeping head of bed elevated at 30 degrees.  Plan Patient Instructions  Set up for home sleep study  Saline nasal spray Twice daily   Saline nasal gel At bedtime   Continue on Singulair 10mg  At bedtime   Flonase  As needed   Claritin 10mg  At bedtime  As needed   Follow up with Dr. Wynona Neat in 6 weeks and As needed       Chronic rhinitis Cough has improved, would continue on Singulair .  Use flonase and claritin As needed   Add saline spray and gel As needed    Plan  Patient Instructions  Set up for home sleep study  Saline nasal spray Twice daily   Saline nasal gel At bedtime   Continue on Singulair 10mg  At bedtime   Flonase  As needed   Claritin 10mg  At bedtime  As needed   Follow up with Dr. Wynona Neat in 6 weeks and As needed         Rubye Oaks, NP 07/13/2023

## 2023-07-13 NOTE — Assessment & Plan Note (Signed)
Cough has improved, would continue on Singulair .  Use flonase and claritin As needed   Add saline spray and gel As needed    Plan  Patient Instructions  Set up for home sleep study  Saline nasal spray Twice daily   Saline nasal gel At bedtime   Continue on Singulair 10mg  At bedtime   Flonase  As needed   Claritin 10mg  At bedtime  As needed   Follow up with Dr. Wynona Neat in 6 weeks and As needed

## 2023-07-21 ENCOUNTER — Telehealth: Payer: Self-pay

## 2023-07-21 MED ORDER — HUMIRA (2 PEN) 40 MG/0.4ML ~~LOC~~ AJKT: AUTO-INJECTOR | SUBCUTANEOUS | 6 refills | Status: DC

## 2023-07-21 NOTE — Telephone Encounter (Signed)
Yes can refill, if you can also book him for a routine office visit as well. Thanks

## 2023-07-21 NOTE — Telephone Encounter (Signed)
Humira refill sent to [facility] specialty pharmacy.  MyChart message sent to patient letting him know that refill for Humira has been sent. Pt needs routine office visit.

## 2023-07-21 NOTE — Telephone Encounter (Signed)
Received refill request for Humira. Ok to refill? Last seen in 06/2022.

## 2023-07-21 NOTE — Addendum Note (Signed)
Addended by: Missy Sabins on: 07/21/2023 04:43 PM   Modules accepted: Orders

## 2023-07-24 NOTE — Telephone Encounter (Signed)
F/u scheduled for 11/29/23 at 9:20 am.

## 2023-08-15 ENCOUNTER — Encounter (INDEPENDENT_AMBULATORY_CARE_PROVIDER_SITE_OTHER): Payer: BC Managed Care – PPO

## 2023-08-15 DIAGNOSIS — G4733 Obstructive sleep apnea (adult) (pediatric): Secondary | ICD-10-CM | POA: Diagnosis not present

## 2023-08-15 DIAGNOSIS — R0683 Snoring: Secondary | ICD-10-CM

## 2023-08-24 ENCOUNTER — Ambulatory Visit: Payer: BC Managed Care – PPO | Admitting: Adult Health

## 2023-08-24 ENCOUNTER — Encounter: Payer: Self-pay | Admitting: Adult Health

## 2023-08-24 VITALS — BP 100/70 | HR 67 | Ht 74.0 in | Wt 194.8 lb

## 2023-08-24 DIAGNOSIS — J310 Chronic rhinitis: Secondary | ICD-10-CM | POA: Diagnosis not present

## 2023-08-24 DIAGNOSIS — G4733 Obstructive sleep apnea (adult) (pediatric): Secondary | ICD-10-CM

## 2023-08-24 NOTE — Assessment & Plan Note (Signed)
Mild obstructive sleep apnea-patient has minimum symptom burden.  Healthy sleep regimen discussed.  Advised to avoid sleeping on his back.  Sleep with his head of the bed inclined at 30 degrees if possible.  Continue with healthy weight management.  May discuss with his dentist regarding oral appliance if needed.  Plan  Patient Instructions  Sleep with Head of bed elevated at 30 incline.  Avoid sleeping on your back  Work on healthy weight .  Discuss with Dr. Toni Arthurs regarding oral appliance for snoring and mild sleep apnea.   Saline nasal spray Twice daily   Saline nasal gel At bedtime   Continue on Singulair 10mg  At bedtime   Flonase  As needed   Claritin 10mg  At bedtime  As needed    Follow up with Dr. Wynona Neat 1 year and As needed

## 2023-08-24 NOTE — Patient Instructions (Addendum)
Sleep with Head of bed elevated at 30 incline.  Avoid sleeping on your back  Work on healthy weight .  Discuss with Dr. Toni Arthurs regarding oral appliance for snoring and mild sleep apnea.   Saline nasal spray Twice daily   Saline nasal gel At bedtime   Continue on Singulair 10mg  At bedtime   Flonase  As needed   Claritin 10mg  At bedtime  As needed    Follow up with Dr. Wynona Neat 1 year and As needed

## 2023-08-24 NOTE — Progress Notes (Signed)
@Patient  ID: James Ortiz, male    DOB: 1986-08-16, 37 y.o.   MRN: [id number]  Chief Complaint  Patient presents with   Follow-up    Referring provider: No ref. provider found  HPI: 37 year old male former smoker seen for consult October 2023 for chronic cough.  Seen back July 13, 2023 for snoring found to have mild obstructive sleep apnea  TEST/EVENTS :   08/24/2023 Follow up : OSA, Chronic Rhinitis , Cough  Patient was initially seen in October 2023 for chronic cough.  Was treated for an upper airway cough syndrome felt to be from chronic allergic rhinitis.  Cough improved with Singulair and Flonase.  Patient did have deviated septum surgery and 2023 with ENT.  Since last visit patient says he is doing well has a very minimal cough.  Allergy symptoms are controlled with Singulair, Claritin and Flonase.  Last visit complained of loud snoring and episodes of possible apneic events.  Patient was set up for a home sleep study that was done on July 25, 2023 that showed mild sleep apnea with a AHI at 11.1/hour and SpO2 low at 85%.  We discussed his sleep study results in detail.  Patient says that he has been working aggressively on weight loss and is down 10 pounds.  He is also been treating his chronic rhinitis more consistently.  Does feel that his snoring has improved.  Patient says he has minimal daytime sleepiness.  We discussed treatment options including healthy weight, positional sleep, oral appliance and CPAP therapy.  Patient says he is going to discuss it with his dentist Dr. Toni Arthurs regarding a possible oral appliance.   Allergies  Allergen Reactions   Milk-Related Compounds    Other Other (See Comments)    Lactose intolerant but able to consume cheese. Avoids milk, ice cream, pudding. MM, RD 04/15/19 Lactose intolerant but able to consume cheese. Avoids milk, ice cream, pudding. MM, RD 04/15/19     Immunization History  Administered Date(s) Administered   Pneumococcal  Conjugate-13 07/14/2020   Pneumococcal Polysaccharide-23 05/13/2021    Past Medical History:  Diagnosis Date   Chest pain    negative stress test   Contusion of rib on left side    Corneal abrasion    non-healing, wore a permanent contact and finally removed yesterday   Crohn's disease (HCC)    ileal disease s/p ileocecectomy    RUQ pain    Sinusitis    Urticaria     Tobacco History: Social History   Tobacco Use  Smoking Status Former   Current packs/day: 1.00   Average packs/day: 1 pack/day for 12.0 years (12.0 ttl pk-yrs)   Types: Cigarettes  Smokeless Tobacco Never   Counseling given: Not Answered   Outpatient Medications Prior to Visit  Medication Sig Dispense Refill   adalimumab (HUMIRA, 2 PEN,) 40 MG/0.4ML pen INJECT 1 PEN UNDER THE SKIN EVERY 14 DAYS. 2 each 6   albuterol (VENTOLIN HFA) 108 (90 Base) MCG/ACT inhaler Inhale 1 puff into the lungs as needed.     carboxymethylcellulose (REFRESH PLUS) 0.5 % SOLN Place 1 drop into the left eye 3 (three) times daily as needed.     ELDERBERRY PO Take 1 Capful by mouth daily.     finasteride (PROPECIA) 1 MG tablet Take 1 mg by mouth daily. Hair loss     montelukast (SINGULAIR) 10 MG tablet Take 1 tablet (10 mg total) by mouth daily. 30 tablet 4   Multiple Vitamins-Minerals (MULTIVITAMIN WITH  MINERALS) tablet Take 1 tablet by mouth daily.     fluticasone (FLONASE) 50 MCG/ACT nasal spray Place into the nose.     No facility-administered medications prior to visit.     Review of Systems:   Constitutional:   No  weight loss, night sweats,  Fevers, chills, fatigue, or  lassitude.  HEENT:   No headaches,  Difficulty swallowing,  Tooth/dental problems, or  Sore throat,                No sneezing, itching, ear ache,  +nasal congestion, post nasal drip,   CV:  No chest pain,  Orthopnea, PND, swelling in lower extremities, anasarca, dizziness, palpitations, syncope.   GI  No heartburn, indigestion, abdominal pain, nausea,  vomiting, diarrhea, change in bowel habits, loss of appetite, bloody stools.   Resp: No shortness of breath with exertion or at rest.  No excess mucus, no productive cough,  No non-productive cough,  No coughing up of blood.  No change in color of mucus.  No wheezing.  No chest wall deformity  Skin: no rash or lesions.  GU: no dysuria, change in color of urine, no urgency or frequency.  No flank pain, no hematuria   MS:  No joint pain or swelling.  No decreased range of motion.  No back pain.    Physical Exam  BP 100/70 (BP Location: Left Arm, Patient Position: Sitting, Cuff Size: Normal)   Pulse 67   Ht 6\' 2"  (1.88 m)   Wt 194 lb 12.8 oz (88.4 kg)   SpO2 96%   BMI 25.01 kg/m   GEN: A/Ox3; pleasant , NAD, well nourished    HEENT:  [place]/AT,   NOSE-clear, THROAT-clear, no lesions, no postnasal drip or exudate noted. Class 2 MP airway   NECK:  Supple w/ fair ROM; no JVD; normal carotid impulses w/o bruits; no thyromegaly or nodules palpated; no lymphadenopathy.    RESP  Clear  P & A; w/o, wheezes/ rales/ or rhonchi. no accessory muscle use, no dullness to percussion  CARD:  RRR, no m/r/g, no peripheral edema, pulses intact, no cyanosis or clubbing.  GI:   Soft & nt; nml bowel sounds; no organomegaly or masses detected.   Musco: Warm bil, no deformities or joint swelling noted.   Neuro: alert, no focal deficits noted.    Skin: Warm, no lesions or rashes    Lab Results:  CBC    Component Value Date/Time   WBC 9.6 07/07/2022 1127   RBC 5.37 07/07/2022 1127   HGB 14.1 07/07/2022 1127   HCT 43.3 07/07/2022 1127   PLT 274.0 07/07/2022 1127   MCV 80.6 07/07/2022 1127   MCH 25.3 (L) 02/01/2019 0452   MCHC 32.5 07/07/2022 1127   RDW 14.6 07/07/2022 1127   LYMPHSABS 2.9 07/07/2022 1127   MONOABS 0.7 07/07/2022 1127   EOSABS 0.2 07/07/2022 1127   BASOSABS 0.1 07/07/2022 1127    BMET    Component Value Date/Time   NA 137 07/07/2022 1127   K 4.3 07/07/2022 1127   CL  101 07/07/2022 1127   CO2 26 07/07/2022 1127   GLUCOSE 99 07/07/2022 1127   BUN 18 07/07/2022 1127   CREATININE 0.92 07/07/2022 1127   CALCIUM 9.6 07/07/2022 1127   GFRNONAA >60 02/01/2019 0452   GFRAA >60 02/01/2019 0452    BNP No results found for: "BNP"  ProBNP No results found for: "PROBNP"  Imaging: No results found.  Administration History     None  No data to display          No results found for: "NITRICOXIDE"      Assessment & Plan:   OSA (obstructive sleep apnea) Mild obstructive sleep apnea-patient has minimum symptom burden.  Healthy sleep regimen discussed.  Advised to avoid sleeping on his back.  Sleep with his head of the bed inclined at 30 degrees if possible.  Continue with healthy weight management.  May discuss with his dentist regarding oral appliance if needed.  Plan  Patient Instructions  Sleep with Head of bed elevated at 30 incline.  Avoid sleeping on your back  Work on healthy weight .  Discuss with Dr. Toni Arthurs regarding oral appliance for snoring and mild sleep apnea.   Saline nasal spray Twice daily   Saline nasal gel At bedtime   Continue on Singulair 10mg  At bedtime   Flonase  As needed   Claritin 10mg  At bedtime  As needed    Follow up with Dr. Wynona Neat 1 year and As needed      Chronic rhinitis Improved control on current regimen     Rubye Oaks, NP 08/24/2023

## 2023-08-24 NOTE — Assessment & Plan Note (Signed)
Improved control on current regimen  

## 2023-09-05 ENCOUNTER — Other Ambulatory Visit: Payer: Self-pay | Admitting: Pulmonary Disease

## 2023-11-21 DIAGNOSIS — B9689 Other specified bacterial agents as the cause of diseases classified elsewhere: Secondary | ICD-10-CM | POA: Diagnosis not present

## 2023-11-21 DIAGNOSIS — J208 Acute bronchitis due to other specified organisms: Secondary | ICD-10-CM | POA: Diagnosis not present

## 2023-11-21 DIAGNOSIS — J0100 Acute maxillary sinusitis, unspecified: Secondary | ICD-10-CM | POA: Diagnosis not present

## 2023-11-29 ENCOUNTER — Ambulatory Visit: Payer: BC Managed Care – PPO | Admitting: Gastroenterology

## 2023-11-29 ENCOUNTER — Other Ambulatory Visit (INDEPENDENT_AMBULATORY_CARE_PROVIDER_SITE_OTHER): Payer: BC Managed Care – PPO

## 2023-11-29 ENCOUNTER — Encounter: Payer: Self-pay | Admitting: Gastroenterology

## 2023-11-29 VITALS — BP 110/70 | HR 80 | Ht 73.5 in | Wt 199.4 lb

## 2023-11-29 DIAGNOSIS — K50818 Crohn's disease of both small and large intestine with other complication: Secondary | ICD-10-CM

## 2023-11-29 DIAGNOSIS — Z79899 Other long term (current) drug therapy: Secondary | ICD-10-CM

## 2023-11-29 DIAGNOSIS — Z23 Encounter for immunization: Secondary | ICD-10-CM | POA: Diagnosis not present

## 2023-11-29 LAB — CBC WITH DIFFERENTIAL/PLATELET
Basophils Absolute: 0 10*3/uL (ref 0.0–0.1)
Basophils Relative: 0.6 % (ref 0.0–3.0)
Eosinophils Absolute: 0.1 10*3/uL (ref 0.0–0.7)
Eosinophils Relative: 1.7 % (ref 0.0–5.0)
HCT: 42.7 % (ref 39.0–52.0)
Hemoglobin: 13.7 g/dL (ref 13.0–17.0)
Lymphocytes Relative: 44.1 % (ref 12.0–46.0)
Lymphs Abs: 2.8 10*3/uL (ref 0.7–4.0)
MCHC: 32.1 g/dL (ref 30.0–36.0)
MCV: 81.3 fL (ref 78.0–100.0)
Monocytes Absolute: 0.5 10*3/uL (ref 0.1–1.0)
Monocytes Relative: 8.4 % (ref 3.0–12.0)
Neutro Abs: 2.9 10*3/uL (ref 1.4–7.7)
Neutrophils Relative %: 45.2 % (ref 43.0–77.0)
Platelets: 263 10*3/uL (ref 150.0–400.0)
RBC: 5.26 Mil/uL (ref 4.22–5.81)
RDW: 14.3 % (ref 11.5–15.5)
WBC: 6.3 10*3/uL (ref 4.0–10.5)

## 2023-11-29 LAB — COMPREHENSIVE METABOLIC PANEL
ALT: 15 U/L (ref 0–53)
AST: 15 U/L (ref 0–37)
Albumin: 4.2 g/dL (ref 3.5–5.2)
Alkaline Phosphatase: 58 U/L (ref 39–117)
BUN: 20 mg/dL (ref 6–23)
CO2: 26 meq/L (ref 19–32)
Calcium: 9.2 mg/dL (ref 8.4–10.5)
Chloride: 104 meq/L (ref 96–112)
Creatinine, Ser: 0.91 mg/dL (ref 0.40–1.50)
GFR: 106.84 mL/min (ref >60.00)
Glucose, Bld: 115 mg/dL — ABNORMAL HIGH (ref 70–99)
Potassium: 4.1 meq/L (ref 3.5–5.1)
Sodium: 137 meq/L (ref 135–145)
Total Bilirubin: 0.9 mg/dL (ref 0.2–1.2)
Total Protein: 7 g/dL (ref 6.0–8.3)

## 2023-11-29 NOTE — Progress Notes (Signed)
HPI :  Crohn's disease history: Ileal-colonic Crohn's disease.  He was diagnosed several years ago, remotely on thiopurines for a period of time but not on therapy for several years.  Was admitted in 2020 with an obstruction at the IC valve. Follow up colonoscopy with me March 2020 showed a distorted IC valve / stenosis at the ileum which could not be traversed. He was referred to Inetta Fermo at [facility] who performed ileocecectomy in April 2020. Surgery went quite well and his obstructive symptoms resolved. Path from his operative report showed chronic active inflammation but no pre-cancerous changes. Historically he has wanted to avoid biologic or medical therapy for his Crohn's disease so we monitored post operatively.  He underwent a colonoscopy with me on July 02 2020. He had active inflammation at the anastomosis and ileum, Rutgeert's score i2.  Unfortunately through his colon he also had numerous erosions concerning for mild colonic activity.  Biopsies confirmed Crohn's colitis in these areas as well.  Initially placed on Humira + thiopurine. 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  Has been on Humira monotherapy since then.   SINCE LAST VISIT   37 year old male here for follow-up visit for his Crohn's disease.  I last saw him July 2023.  He has remained on Humira monotherapy every 2 weeks since our last visit and generally doing okay.  He had a respiratory infection at our last visit.  Generally he states he has continued to tolerate it pretty well.  He has had a recent fairly large bruise after his last injection in his thigh, he states that is pretty rare and generally tolerates the injections very well.  He denies any problems or flares of his Crohn's disease since have last seen him.  No problems with his bowel habits, no abdominal pains on a routine basis.  His weight has been stable, just around 200 pounds.  His last colonoscopy was in September 2022, generally  things looked okay.  He had very faint/mild inflammation in his distal rectum with some mild inflammation there.  He had a fecal calprotectin this past January of 105.  He denies any symptoms that again that have really bothered him in the interim.  In regards to his healthcare maintenance, his vaccinations are up-to-date to include Shingrix vaccine and pneumococcal.  He is due for his flu shot this year, he has not had that yet.  He is due for TB testing and basic labs.  He has not had any major illnesses since of last seen him.  He was diagnosed with sleep apnea this past summer, follows with pulmonary.  He is transitioning to a new job in the near future and we discussed that for a bit.    IBD Health Care Maintenance: Annual Flu Vaccine - Date 2024 - due Pneumococcal Vaccine - PCV13 done 07/14/20, PPSSV 23 Sept 2022 Zoster vaccine  done 2022/2023 TB testing if on anti-TNF, - Date 06/2022 Vitamin D screening - UTD Last Colonoscopy - Date 2022 COVID VACCINE x 2 + Booster   Colonoscopy 03/01/19 - The perianal and digital rectal examinations were normal. - The IC valve was deformed and scarred down from Crohn's disease. A ? fistulous tract was noted just inferior to the valve. A benign-appearing, intrinsic severe stenosis was found at the ileocecal valve. The lumen was only a few mm wide and could not be traversed. No obvious inflammatory change was noted. Biopsies were taken with a cold forceps for histology. - Polypoid  lesions were found in the cecum and at the appendiceal orifice, most consistent with benign pseudoinflammatory polyps. Biopsies were taken with a cold forceps for histology. - Scattered medium-mouthed diverticula were found in the entire colon. - Internal hemorrhoids were found during retroflexion. The hemorrhoids were small. - The exam was otherwise without abnormality. No colonic inflammation   Surgery 04/12/19 - ileocecectomy - Dr. Inetta Fermo Path shows chronic active  inflammation, with polypoid lesion in the cecum removed - no evidence of atypical lymphoid proliferation   CT scan 06/26/19 - s/p ileocecectomy, multiple benign appearing lymph nodes in RLQ, benign small pulmonary nodule   Colonoscopy 07/02/20 - The perianal and digital rectal examinations were normal. - There was evidence of a prior end-to-side ileo-colonic anastomosis in the ascending colon. This was patent and was characterized by a few apthous ulcerations but was widely patent. - Patchy inflammation, graded as Rutgeerts Score i2 (more than five aphthous lesions with normal intervening mucosa or skip areas of larger lesions or lesions confined to the ileocolonic anastomosis) and characterized by erosions and aphthous ulcerations was found in the terminal ileum. - A few isolated erosions were found in the descending colon, in the transverse colon and in the ascending colon. Biopsies were taken with a cold forceps for histology. - Scattered small-mouthed diverticula were found in the transverse colon and left colon. - Internal hemorrhoids were found during retroflexion. - The exam was otherwise without abnormality.   Diagnosis Surgical [P], colon - CHRONIC ACTIVE COLITIS - NO GRANULOMATA, DYSPLASIA OR MALIGNANCY IDENTIFIED - SEE COMMENT   Colonoscopy 08/20/21: The perianal and digital rectal examinations were normal. - The terminal ileum appeared normal. - There was evidence of a prior end-to-end ileo-colonic anastomosis in the ascending colon. This was patent and was characterized by healthy appearing mucosa. The anastomosis was traversed. - Scattered medium-mouthed diverticula were found in the transverse colon and left colon. - A diminutive polyp was found in the sigmoid colon. The polyp was sessile. The polyp was removed with a cold biopsy forceps. Resection and retrieval were complete. - Internal hemorrhoids were found during retroflexion. - An area of mildly altered mucosa was  found in the distal rectum, possible very mildly active colitis. Biopsies were taken with a cold forceps for histology. - The exam was otherwise without abnormality. - Biopsies were taken with a cold forceps in the sigmoid colon, in the descending colon, in the transverse colon and in the ascending colon for histology.   1. Surgical [P], right colon bx - BENIGN COLONIC MUCOSA - NO ACTIVE INFLAMMATION OR EVIDENCE OF MICROSCOPIC COLITIS - NO HIGH GRADE DYSPLASIA OR MALIGNANCY IDENTIFIED 2. Surgical [P], transverse colon bx - BENIGN COLONIC MUCOSA - NO ACTIVE INFLAMMATION OR EVIDENCE OF MICROSCOPIC COLITIS - NO HIGH GRADE DYSPLASIA OR MALIGNANCY IDENTIFIED 3. Surgical [P], left colon bx - BENIGN COLONIC MUCOSA - NO ACTIVE INFLAMMATION OR EVIDENCE OF MICROSCOPIC COLITIS - NO HIGH GRADE DYSPLASIA OR MALIGNANCY IDENTIFIED 4. Surgical [P], colon, rectum bx - CHRONIC FOCALLY ACTIVE COLITIS - NO GRANULOMATA, DYSPLASIA OR MALIGNANCY IDENTIFIED 5. Surgical [P], colon, sigmoid polyp x1, polyp (1) - BENIGN COLONIC MUCOSA WITH UNDERLYING LYMPHOID AGGREGATE (1 OF 1 FRAGMENTS) - NO HIGH-GRADE DYSPLASIA OR MALIGNANCY IDENTIFIED - SEE COMMENT   Fecal calprotectin Jan 2024 - 105  Past Medical History:  Diagnosis Date   Chest pain    negative stress test   Contusion of rib on left side    Corneal abrasion    non-healing, wore a permanent  contact and finally removed yesterday   Crohn's disease (HCC)    ileal disease s/p ileocecectomy    RUQ pain    Sinusitis    Urticaria      Past Surgical History:  Procedure Laterality Date   COLONOSCOPY  08/20/2021   EYE SURGERY Bilateral    KNEE SURGERY     VASECTOMY     Family History  Problem Relation Age of Onset   Crohn's disease Father    Other Other        HEALTHY   Other Other        HEALTHY   Other Other        HEALTHY   Colon cancer Neg Hx    Esophageal cancer Neg Hx    Stomach cancer Neg Hx    Social History   Tobacco Use    Smoking status: Former    Current packs/day: 1.00    Average packs/day: 1 pack/day for 12.0 years (12.0 ttl pk-yrs)    Types: Cigarettes   Smokeless tobacco: Never  Vaping Use   Vaping status: Former   Devices: trying to quit   Substance Use Topics   Alcohol use: Yes    Comment: occasionally   Drug use: No   Current Outpatient Medications  Medication Sig Dispense Refill   adalimumab (HUMIRA, 2 PEN,) 40 MG/0.4ML pen INJECT 1 PEN UNDER THE SKIN EVERY 14 DAYS. 2 each 6   albuterol (VENTOLIN HFA) 108 (90 Base) MCG/ACT inhaler Inhale 1 puff into the lungs as needed.     carboxymethylcellulose (REFRESH PLUS) 0.5 % SOLN Place 1 drop into the left eye 3 (three) times daily as needed.     ELDERBERRY PO Take 1 Capful by mouth daily.     finasteride (PROPECIA) 1 MG tablet Take 1 mg by mouth daily. Hair loss     fluticasone (FLONASE) 50 MCG/ACT nasal spray Place into the nose.     montelukast (SINGULAIR) 10 MG tablet TAKE 1 TABLET BY MOUTH DAILY 90 tablet 3   Multiple Vitamins-Minerals (MULTIVITAMIN WITH MINERALS) tablet Take 1 tablet by mouth daily.     No current facility-administered medications for this visit.   Allergies  Allergen Reactions   Milk-Related Compounds    Other Other (See Comments)    Lactose intolerant but able to consume cheese. Avoids milk, ice cream, pudding. MM, RD 04/15/19 Lactose intolerant but able to consume cheese. Avoids milk, ice cream, pudding. MM, RD 04/15/19      Review of Systems: All systems reviewed and negative except where noted in HPI.    Physical Exam: BP 110/70 (BP Location: Left Arm, Patient Position: Sitting, Cuff Size: Normal)   Pulse 80   Ht 6' 1.5" (1.867 m)   Wt 199 lb 6 oz (90.4 kg)   BMI 25.95 kg/m  Constitutional: Pleasant,well-developed, male in no acute distress. Neurological: Alert and oriented to person place and time. Psychiatric: Normal mood and affect. Behavior is normal.   ASSESSMENT: 37 y.o. male here for assessment of  the following  1. Crohn's disease of both small and large intestine with other complication (HCC)   2. 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  Very mild rectal inflammation on last colonoscopy, his fecal calprotectin was borderline.  He is completely asymptomatic.  He tolerates Humira well.  Discussed risks and benefits of Humira and he understands and wants to continue  it.  We discussed his course to date.  I would like to repeat a fecal calprotectin to see where this is trending.  If it is rising over time or he develops symptoms then would have low threshold to check his Humira levels and adjust accordingly if needed.  He is agreeable to fecal calprotectin.  Otherwise due for some basic labs today, will obtain CBC and c-Met.  He is also due for TB testing, will send QuantiFERON gold.  He did not get his flu shot this year, discussed that with him, he is agreeable to today, flu shot given.  Otherwise as long as he feels well, labs look okay, no flares of his Crohn's and fecal calprotectin remains okay, we will continue his present regimen.  I will see him again in 6 to 9 months or so, if any issues or flares in the interim he will contact me.  He agrees.   PLAN: - continue Humira as scheduled - lab for fecal calprotectin - lab for CBC, CMET, quantiferon gold - flu shot today  - f/u 6-9 months or sooner with issues  Harlin Rain, MD [facility]

## 2023-11-29 NOTE — Patient Instructions (Addendum)
Please go to the lab in the basement of our building to have lab work done as you leave today. Hit "B" for basement when you get on the elevator.  When the doors open the lab is on your left.  We will call you with the results. Thank you.  We are giving you the flu vaccine today.  Thank you for entrusting me with your care and for choosing [facility], Dr. Ileene Patrick    If your blood pressure at your visit was 140/90 or greater, please contact your primary care physician to follow up on this. ______________________________________________________  If you are age 29 or older, your body mass index should be between 23-30. Your Body mass index is 25.95 kg/m. If this is out of the aforementioned range listed, please consider follow up with your Primary Care Provider.  If you are age 48 or younger, your body mass index should be between 19-25. Your Body mass index is 25.95 kg/m. If this is out of the aformentioned range listed, please consider follow up with your Primary Care Provider.  ________________________________________________________  The [place] [facility] providers would like to encourage you to use [facility] to communicate with providers for non-urgent requests or questions.  Due to long hold times on the telephone, sending your provider a message by [facility] may be a faster and more efficient way to get a response.  Please allow 48 business hours for a response.  Please remember that this is for non-urgent requests.  _______________________________________________________  Due to recent changes in healthcare laws, you may see the results of your imaging and laboratory studies on MyChart before your provider has had a chance to review them.  We understand that in some cases there may be results that are confusing or concerning to you. Not all laboratory results come back in the same time frame and the provider may be waiting for multiple results in order to interpret others.  Please  give [place] 48 hours in order for your provider to thoroughly review all the results before contacting the office for clarification of your results.

## 2023-12-02 LAB — QUANTIFERON-TB GOLD PLUS
Mitogen-NIL: 9.72 [IU]/mL
NIL: 0.02 [IU]/mL
QuantiFERON-TB Gold Plus: NEGATIVE
TB1-NIL: 0 [IU]/mL
TB2-NIL: 0 [IU]/mL

## 2023-12-08 ENCOUNTER — Other Ambulatory Visit: Payer: BC Managed Care – PPO

## 2023-12-08 DIAGNOSIS — Z79899 Other long term (current) drug therapy: Secondary | ICD-10-CM | POA: Diagnosis not present

## 2023-12-08 DIAGNOSIS — K50818 Crohn's disease of both small and large intestine with other complication: Secondary | ICD-10-CM | POA: Diagnosis not present

## 2023-12-11 LAB — CALPROTECTIN, FECAL: Calprotectin, Fecal: 569 ug/g — ABNORMAL HIGH (ref 0–120)

## 2023-12-15 ENCOUNTER — Other Ambulatory Visit: Payer: Self-pay | Admitting: *Deleted

## 2023-12-15 DIAGNOSIS — Z79899 Other long term (current) drug therapy: Secondary | ICD-10-CM

## 2023-12-15 DIAGNOSIS — K50818 Crohn's disease of both small and large intestine with other complication: Secondary | ICD-10-CM

## 2023-12-15 DIAGNOSIS — R195 Other fecal abnormalities: Secondary | ICD-10-CM

## 2023-12-22 ENCOUNTER — Other Ambulatory Visit: Payer: BC Managed Care – PPO

## 2023-12-22 DIAGNOSIS — Z79899 Other long term (current) drug therapy: Secondary | ICD-10-CM

## 2023-12-22 DIAGNOSIS — R195 Other fecal abnormalities: Secondary | ICD-10-CM | POA: Diagnosis not present

## 2023-12-22 DIAGNOSIS — K50818 Crohn's disease of both small and large intestine with other complication: Secondary | ICD-10-CM

## 2023-12-30 LAB — SERIAL MONITORING

## 2024-01-01 LAB — ADALIMUMAB+AB (SERIAL MONITOR)
Adalimumab Drug Level: 11 ug/mL
Anti-Adalimumab Antibody: <25 ng/mL

## 2024-01-05 DIAGNOSIS — Z113 Encounter for screening for infections with a predominantly sexual mode of transmission: Secondary | ICD-10-CM | POA: Diagnosis not present

## 2024-01-05 DIAGNOSIS — R8271 Bacteriuria: Secondary | ICD-10-CM | POA: Diagnosis not present

## 2024-01-08 DIAGNOSIS — J0100 Acute maxillary sinusitis, unspecified: Secondary | ICD-10-CM | POA: Diagnosis not present

## 2024-01-08 DIAGNOSIS — H6693 Otitis media, unspecified, bilateral: Secondary | ICD-10-CM | POA: Diagnosis not present

## 2024-01-25 ENCOUNTER — Encounter: Payer: Self-pay | Admitting: Gastroenterology

## 2024-01-25 ENCOUNTER — Telehealth: Payer: Self-pay

## 2024-01-25 NOTE — Telephone Encounter (Signed)
 Patient sent a MyChart message with updated insurance information. Insurance and prescription cards are under [employer]. Patient also mentioned possible savings card through [employer]? I am not sure about this.  Please initiate PA for Humira  40 mg every 2 weeks. Please let us  know once we have a response from the insurance company. Thanks

## 2024-01-30 ENCOUNTER — Telehealth: Payer: Self-pay | Admitting: Pharmacy Technician

## 2024-01-30 ENCOUNTER — Other Ambulatory Visit (HOSPITAL_COMMUNITY): Payer: Self-pay

## 2024-01-30 NOTE — Telephone Encounter (Signed)
Pharmacy Patient Advocate Encounter   Received notification from Physician's Office that prior authorization for HUMIRA 40MG  is required/requested.   Insurance verification completed.   The patient is insured through [facility] .   Per test claim: PA required; PA submitted to above mentioned insurance via CoverMyMeds Key/confirmation #/EOC [id number] Status is pending

## 2024-01-30 NOTE — Telephone Encounter (Signed)
Secure chat sent to [place] to follow up on PA.

## 2024-01-31 NOTE — Telephone Encounter (Signed)
[facility], do we need a PA for Amjevita as well or can we go ahead and order?

## 2024-01-31 NOTE — Telephone Encounter (Signed)
Thank you. If they are denying the Humira, okay to switch to the biosimilar (Amjevita sp?). Thanks.

## 2024-01-31 NOTE — Telephone Encounter (Signed)
Pharmacy Patient Advocate Encounter  Received notification from [facility] that Prior Authorization for HUMIRA 40MG  has been DENIED.  Full denial letter will be uploaded to the media tab. See denial reason below.   PA #/Case ID/Reference #: [id number]  Per your health plan's criteria, this drug is covered if you meet the following: (1) Your doctor provides medical records (for example: chart notes, lab values) showing you have one of the following conditions: Frequent diarrhea and abdominal pain, at least 10% weight loss, complications (such as obstruction, fever, abdominal mass), abnormal lab value (for example: Creactive protein), or Crohn's disease activity index (CDAI) greater than 220. (2) One of the following: (A) There are paid claims or your doctor provides your medical records (for example: chart notes) confirming you have tried or cannot use one the following drugs: 6-mercaptopurine, azathioprine, corticosteroids (for example: prednisone, methylprednisolone), or methotrexate. (B) There are paid claims or your doctor provides your medical records (for example: chart notes)confirming you have tried (or lost response to) or cannot use infliximab. (3) One of the following: (A) Both of the following: (I) There are paid claims or your doctor provides medical records (for example: chart notes) showing you have tried at least a six-month supply of Amjevita [manufactured by [employer] (preferred adalimumab-atto; Tier 2)]. (II) Your doctor provides medical records showing evidence (objective information) that you did not have enough response [for example: change in Crohn's Disease Activity Index (CDAI) score or other assessment tool]. (B) Your doctor provides medical records showing you had an allergic reaction to a specific (nonactive) ingredient in Amjevita [manufactured by [employer] (adalimumab-atto; Tier 2)]. (4) One of the following: (A) Both of the following: (I) There are paid claims or  your doctor provides medical records (for example: chart notes) showing you have failed at least a six-month trial or cannot use two of the following: Brand Adalimumab-adbm, Simlandi (adalimumab-ryvk), Hadlima (adalimumab-bwwd). (II) Your doctor provides medical records showing evidence (objective information) that you did not have enough response [for example: change in Crohn's Disease Activity Index (CDAI) score or other assessment tool]. (B) Your doctor provides medical records showing you had an allergic reaction to a specific (nonactive) ingredient in all of the following: Brand Adalimumab-adbm, Simlandi (adalimumab-ryvk) and Hadlima (adalimumab-bwwd). The information provided does not show that you meet the criteria listed above. Please speak with your doctor about your options.

## 2024-01-31 NOTE — Telephone Encounter (Signed)
Monchell, please submit office notes from 11/29/23 - patient has failed Thiopurine - pt developed hepatotoxicity, and had an obstruction in 2020.

## 2024-01-31 NOTE — Telephone Encounter (Signed)
See alternate telephone encounter for details on PA

## 2024-02-02 ENCOUNTER — Other Ambulatory Visit (HOSPITAL_COMMUNITY): Payer: Self-pay

## 2024-02-02 ENCOUNTER — Telehealth: Payer: Self-pay | Admitting: Pharmacy Technician

## 2024-02-02 NOTE — Telephone Encounter (Signed)
Pharmacy Patient Advocate Encounter   Received notification from CoverMyMeds that prior authorization for AMJEVITA 40MG  is required/requested.   Insurance verification completed.   The patient is insured through [facility] .   Per test claim: PA required; PA submitted to above mentioned insurance via CoverMyMeds Key/confirmation #/EOC [id number] Status is pending

## 2024-02-02 NOTE — Telephone Encounter (Signed)
Pharmacy Patient Advocate Encounter  Received notification from [facility] that Prior Authorization for AMJEVITA 40MG  has been APPROVED from 2.14.25 to 8.14.25   PA #/Case ID/Reference #: [id number]

## 2024-02-05 DIAGNOSIS — J0100 Acute maxillary sinusitis, unspecified: Secondary | ICD-10-CM | POA: Diagnosis not present

## 2024-02-05 MED ORDER — AMJEVITA 40 MG/0.4ML ~~LOC~~ SOAJ: 1.0000 "pen " | SUBCUTANEOUS | 11 refills | Status: DC

## 2024-02-05 NOTE — Telephone Encounter (Signed)
 Patient notified via MyChart. See 2/5 patient message.

## 2024-02-21 ENCOUNTER — Encounter: Payer: Self-pay | Admitting: Gastroenterology

## 2024-02-21 DIAGNOSIS — J3489 Other specified disorders of nose and nasal sinuses: Secondary | ICD-10-CM

## 2024-02-21 DIAGNOSIS — Z7689 Persons encountering health services in other specified circumstances: Secondary | ICD-10-CM

## 2024-02-22 NOTE — Addendum Note (Signed)
 Addended by: Marisa Sprinkles on: 02/22/2024 09:19 AM   Modules accepted: Orders

## 2024-03-11 ENCOUNTER — Telehealth: Payer: Self-pay

## 2024-03-11 DIAGNOSIS — K50818 Crohn's disease of both small and large intestine with other complication: Secondary | ICD-10-CM

## 2024-03-11 NOTE — Telephone Encounter (Signed)
 Order placed for fecal calprotectin. MyChart message to patient

## 2024-03-11 NOTE — Telephone Encounter (Signed)
-----   Message from [facility] sent at 01/02/2024  1:04 PM EST ----- Regarding: fecal cal Place order and have patient go to lab for fecal calprotectin

## 2024-03-15 ENCOUNTER — Other Ambulatory Visit

## 2024-05-01 ENCOUNTER — Ambulatory Visit (INDEPENDENT_AMBULATORY_CARE_PROVIDER_SITE_OTHER): Admitting: Otolaryngology

## 2024-05-01 ENCOUNTER — Encounter (INDEPENDENT_AMBULATORY_CARE_PROVIDER_SITE_OTHER): Payer: Self-pay | Admitting: Otolaryngology

## 2024-05-01 VITALS — BP 126/83 | HR 99 | Ht 74.0 in | Wt 195.0 lb

## 2024-05-01 DIAGNOSIS — J324 Chronic pansinusitis: Secondary | ICD-10-CM

## 2024-05-01 DIAGNOSIS — R0981 Nasal congestion: Secondary | ICD-10-CM | POA: Diagnosis not present

## 2024-05-01 DIAGNOSIS — J310 Chronic rhinitis: Secondary | ICD-10-CM | POA: Diagnosis not present

## 2024-05-01 DIAGNOSIS — G8929 Other chronic pain: Secondary | ICD-10-CM

## 2024-05-01 DIAGNOSIS — J343 Hypertrophy of nasal turbinates: Secondary | ICD-10-CM | POA: Diagnosis not present

## 2024-05-01 DIAGNOSIS — Z87891 Personal history of nicotine dependence: Secondary | ICD-10-CM

## 2024-05-01 DIAGNOSIS — R519 Headache, unspecified: Secondary | ICD-10-CM | POA: Diagnosis not present

## 2024-05-02 DIAGNOSIS — R519 Headache, unspecified: Secondary | ICD-10-CM | POA: Insufficient documentation

## 2024-05-02 DIAGNOSIS — J324 Chronic pansinusitis: Secondary | ICD-10-CM | POA: Insufficient documentation

## 2024-05-02 DIAGNOSIS — J343 Hypertrophy of nasal turbinates: Secondary | ICD-10-CM | POA: Insufficient documentation

## 2024-05-02 NOTE — Progress Notes (Signed)
 CC: Recurrent sinusitis, chronic headaches  HPI:  James Ortiz is a 38 y.o. male who presents today complaining of recurrent sinusitis and chronic headaches.  According to the patient, he underwent septoplasty and bilateral endoscopic sinus surgery 2 years ago.  His symptoms initially improved.  However, over the past year, he has noted increasing nasal congestion, sinus pressure, postnasal drainage, and headaches.  He is currently on Flonase nasal spray daily.  He also uses an inhaler.  He denies any fever or visual change.  He has no recent sinus CT scan.  Past Medical History:  Diagnosis Date   Chest pain    negative stress test   Contusion of rib on left side    Corneal abrasion    non-healing, wore a permanent contact and finally removed yesterday   Crohn's disease (HCC)    ileal disease s/p ileocecectomy    RUQ pain    Sinusitis    Urticaria     Past Surgical History:  Procedure Laterality Date   COLONOSCOPY  08/20/2021   EYE SURGERY Bilateral    KNEE SURGERY     VASECTOMY      Family History  Problem Relation Age of Onset   Crohn's disease Father    Other Other        HEALTHY   Other Other        HEALTHY   Other Other        HEALTHY   Colon cancer Neg Hx    Esophageal cancer Neg Hx    Stomach cancer Neg Hx     Social History:  reports that he has quit smoking. His smoking use included cigarettes. He has a 12 pack-year smoking history. He has never used smokeless tobacco. He reports current alcohol use. He reports that he does not use drugs.  Allergies:  Allergies  Allergen Reactions   Milk-Related Compounds    Other Other (See Comments)    Lactose intolerant but able to consume cheese. Avoids milk, ice cream, pudding. MM, RD 04/15/19 Lactose intolerant but able to consume cheese. Avoids milk, ice cream, pudding. MM, RD 04/15/19     Prior to Admission medications   Medication Sig Start Date End Date Taking? Authorizing Provider  Adalimumab -atto  (AMJEVITA ) 40 MG/0.4ML SOAJ Inject 1 pen  into the skin every 14 (fourteen) days. 02/05/24  Yes Armbruster, Lendon Queen, MD  albuterol (VENTOLIN HFA) 108 (90 Base) MCG/ACT inhaler Inhale 1 puff into the lungs as needed. 06/27/22  Yes [provider]  carboxymethylcellulose (REFRESH PLUS) 0.5 % SOLN Place 1 drop into the left eye 3 (three) times daily as needed.   Yes [provider]  ELDERBERRY PO Take 1 Capful by mouth daily.   Yes [provider]  finasteride (PROPECIA) 1 MG tablet Take 1 mg by mouth daily. Hair loss   Yes [provider]  montelukast  (SINGULAIR ) 10 MG tablet TAKE 1 TABLET BY MOUTH DAILY 09/06/23  Yes Parrett, Tammy S, NP  Multiple Vitamins-Minerals (MULTIVITAMIN WITH MINERALS) tablet Take 1 tablet by mouth daily.   Yes [provider]  fluticasone (FLONASE) 50 MCG/ACT nasal spray Place into the nose. 06/17/22 11/29/23  [provider]    Blood pressure 126/83, pulse 99, height 6\' 2"  (1.88 m), weight 195 lb (88.5 kg), SpO2 97%. Exam: General: Communicates without difficulty, well nourished, no acute distress. Head: Normocephalic, no evidence injury, no tenderness, facial buttresses intact without stepoff. Face/sinus: No tenderness to palpation and percussion. Facial movement is  normal and symmetric. Eyes: PERRL, EOMI. No scleral icterus, conjunctivae clear. Neuro: CN II exam reveals vision grossly intact.  No nystagmus at any point of gaze. Ears: Auricles well formed without lesions.  Ear canals are intact without mass or lesion.  No erythema or edema is appreciated.  The TMs are intact without fluid. Nose: External evaluation reveals normal support and skin without lesions.  Dorsum is intact.  Anterior rhinoscopy reveals congested mucosa over anterior aspect of inferior turbinates and intact septum.  No purulence noted. Oral:  Oral cavity and oropharynx are intact, symmetric, without erythema or edema.  Mucosa is moist without lesions.  Neck: Full range of motion without pain.  There is no significant lymphadenopathy.  No masses palpable.  Thyroid bed within normal limits to palpation.  Parotid glands and submandibular glands equal bilaterally without mass.  Trachea is midline. Neuro:  CN 2-12 grossly intact.   Procedure:  Flexible Nasal Endoscopy: Description: Risks, benefits, and alternatives of flexible endoscopy were explained to the patient.  Specific mention was made of the risk of throat numbness with difficulty swallowing, possible bleeding from the nose and mouth, and pain from the procedure.  The patient gave oral consent to proceed.  The flexible scope was inserted into the right nasal cavity.  Endoscopy of the interior nasal cavity, superior, inferior, and middle meatus was performed. The sphenoid-ethmoid recess was examined. Edematous mucosa was noted.  The sinus openings were patent.  No polyp, mass, or lesion was appreciated.  Olfactory cleft was clear.  Nasopharynx was clear.  Turbinates were hypertrophied but without mass.  The procedure was repeated on the contralateral side with similar findings.  The patient tolerated the procedure well.    Assessment: 1.  Chronic rhinitis with nasal mucosal congestion and bilateral inferior turbinate hypertrophy. 2.  His sinus openings are patent bilaterally.  No polyps or purulent drainage is noted today. 3.  Persistent facial pressure and pain.  Plan: 1.  The physical exam and nasal endoscopy findings are reviewed with the patient. 2.  Continue with Flonase nasal spray daily. 3.  Nasal saline irrigation is encouraged. 4.  Sinus CT scan to evaluate for possible chronic sinusitis. 5.  The patient will return for reevaluation after his sinus CT scan.  Jennfer Gassen W Taisha Pennebaker 05/02/2024, 1:13 PM

## 2024-05-10 ENCOUNTER — Ambulatory Visit: Admitting: Family Medicine

## 2024-05-15 ENCOUNTER — Encounter: Payer: Self-pay | Admitting: Family Medicine

## 2024-05-15 ENCOUNTER — Ambulatory Visit: Admitting: Family Medicine

## 2024-05-15 VITALS — BP 116/82 | HR 90 | Temp 97.7°F | Ht 74.0 in | Wt 204.0 lb

## 2024-05-15 DIAGNOSIS — E663 Overweight: Secondary | ICD-10-CM

## 2024-05-15 DIAGNOSIS — J3089 Other allergic rhinitis: Secondary | ICD-10-CM | POA: Diagnosis not present

## 2024-05-15 LAB — COMPREHENSIVE METABOLIC PANEL WITH GFR
ALT: 21 U/L (ref 0–53)
AST: 19 U/L (ref 0–37)
Albumin: 4.3 g/dL (ref 3.5–5.2)
Alkaline Phosphatase: 60 U/L (ref 39–117)
BUN: 18 mg/dL (ref 6–23)
CO2: 27 meq/L (ref 19–32)
Calcium: 9.3 mg/dL (ref 8.4–10.5)
Chloride: 104 meq/L (ref 96–112)
Creatinine, Ser: 0.91 mg/dL (ref 0.40–1.50)
GFR: 106.5 mL/min (ref >60.00)
Glucose, Bld: 92 mg/dL (ref 70–99)
Potassium: 4 meq/L (ref 3.5–5.1)
Sodium: 137 meq/L (ref 135–145)
Total Bilirubin: 1 mg/dL (ref 0.2–1.2)
Total Protein: 7.3 g/dL (ref 6.0–8.3)

## 2024-05-15 LAB — CBC WITH DIFFERENTIAL/PLATELET
Basophils Absolute: 0 10*3/uL (ref 0.0–0.1)
Basophils Relative: 0.5 % (ref 0.0–3.0)
Eosinophils Absolute: 0.2 10*3/uL (ref 0.0–0.7)
Eosinophils Relative: 2.1 % (ref 0.0–5.0)
HCT: 42.2 % (ref 39.0–52.0)
Hemoglobin: 13.8 g/dL (ref 13.0–17.0)
Lymphocytes Relative: 41.1 % (ref 12.0–46.0)
Lymphs Abs: 3 10*3/uL (ref 0.7–4.0)
MCHC: 32.8 g/dL (ref 30.0–36.0)
MCV: 79.8 fl (ref 78.0–100.0)
Monocytes Absolute: 0.6 10*3/uL (ref 0.1–1.0)
Monocytes Relative: 7.8 % (ref 3.0–12.0)
Neutro Abs: 3.5 10*3/uL (ref 1.4–7.7)
Neutrophils Relative %: 48.5 % (ref 43.0–77.0)
Platelets: 240 10*3/uL (ref 150.0–400.0)
RBC: 5.28 Mil/uL (ref 4.22–5.81)
RDW: 14.3 % (ref 11.5–15.5)
WBC: 7.3 10*3/uL (ref 4.0–10.5)

## 2024-05-15 LAB — LIPID PANEL
Cholesterol: 179 mg/dL (ref 0–200)
HDL: 51.4 mg/dL (ref >39.00)
LDL Cholesterol: 83 mg/dL (ref 0–99)
NonHDL: 127.68
Total CHOL/HDL Ratio: 3
Triglycerides: 222 mg/dL — ABNORMAL HIGH (ref 0.0–149.0)
VLDL: 44.4 mg/dL — ABNORMAL HIGH (ref 0.0–40.0)

## 2024-05-15 LAB — TSH: TSH: 2.35 u[IU]/mL (ref 0.35–5.50)

## 2024-05-15 LAB — HEMOGLOBIN A1C: Hgb A1c MFr Bld: 5.9 % (ref 4.6–6.5)

## 2024-05-15 NOTE — Progress Notes (Signed)
 New Patient Office Visit  Subjective   Patient ID: James Ortiz, male    DOB: May 20, 1986  Age: 38 y.o. MRN: [id number]  CC:  Chief Complaint  Patient presents with   Establish Care    HPI James Ortiz presents to establish care with new provider.  Patients previous primary care provider: None   Specialist: [facility] Specialist Dr. Reynold Caves [place] [facility] Dr. Alvester Johnson  [facility] with Roena Clark, NP   Patient is concerned about environmental and seasonal allergies. He is currently taking Montelukast  and Fluticasone nasal spray. He is interested in allergy shots. He had them when he was younger.  Outpatient Encounter Medications as of 05/15/2024  Medication Sig   Adalimumab -atto (AMJEVITA ) 40 MG/0.4ML SOAJ Inject 1 pen  into the skin every 14 (fourteen) days.   albuterol (VENTOLIN HFA) 108 (90 Base) MCG/ACT inhaler Inhale 1 puff into the lungs as needed.   carboxymethylcellulose (REFRESH PLUS) 0.5 % SOLN Place 1 drop into the left eye 3 (three) times daily as needed.   finasteride (PROPECIA) 1 MG tablet Take 1 mg by mouth daily. Hair loss   montelukast  (SINGULAIR ) 10 MG tablet TAKE 1 TABLET BY MOUTH DAILY   Multiple Vitamins-Minerals (MULTIVITAMIN WITH MINERALS) tablet Take 1 tablet by mouth daily.   [DISCONTINUED] ELDERBERRY PO Take 1 Capful by mouth daily.   fluticasone (FLONASE) 50 MCG/ACT nasal spray Place into the nose.   No facility-administered encounter medications on file as of 05/15/2024.    Past Medical History:  Diagnosis Date   Chest pain    negative stress test   Contusion of rib on left side    Corneal abrasion    non-healing, wore a permanent contact and finally removed yesterday   Crohn's disease (HCC)    ileal disease s/p ileocecectomy    RUQ pain    Sinusitis    Urticaria     Past Surgical History:  Procedure Laterality Date   COLON SURGERY     2020   COLONOSCOPY  08/20/2021   EYE SURGERY Bilateral    KNEE  SURGERY     VASECTOMY      Family History  Problem Relation Age of Onset   Crohn's disease Father    Mental illness Sister    Colon cancer Neg Hx    Esophageal cancer Neg Hx    Stomach cancer Neg Hx     Social History   Socioeconomic History   Marital status: Married    Spouse name: Not on file   Number of children: 2   Years of education: Not on file   Highest education level: Bachelor's degree (e.g., BA, AB, BS)  Occupational History   Occupation: Research scientist (medical)  Tobacco Use   Smoking status: Former    Current packs/day: 1.00    Average packs/day: 1 pack/day for 12.0 years (12.0 ttl pk-yrs)    Types: Cigarettes   Smokeless tobacco: Never  Vaping Use   Vaping status: Former   Devices: trying to quit   Substance and Sexual Activity   Alcohol use: Yes    Comment: Weekends   Drug use: No   Sexual activity: Yes  Other Topics Concern   Not on file  Social History Narrative   Not on file   Social Drivers of Health   Financial Resource Strain: Low Risk  (05/15/2024)   Overall Financial Resource Strain (CARDIA)    Difficulty of Paying Living Expenses: Not hard at all  Food  Insecurity: No Food Insecurity (05/15/2024)   Hunger Vital Sign    Worried About Running Out of Food in the Last Year: Never true    Ran Out of Food in the Last Year: Never true  Transportation Needs: No Transportation Needs (05/15/2024)   PRAPARE - Administrator, Civil Service (Medical): No    Lack of Transportation (Non-Medical): No  Physical Activity: Insufficiently Active (05/15/2024)   Exercise Vital Sign    Days of Exercise per Week: 3 days    Minutes of Exercise per Session: 20 min  Stress: No Stress Concern Present (05/15/2024)   [employer] of Occupational Health - Occupational Stress Questionnaire    Feeling of Stress : Not at all  Social Connections: Moderately Integrated (05/15/2024)   Social Connection and Isolation Panel [NHANES]    Frequency of Communication with  Friends and Family: More than three times a week    Frequency of Social Gatherings with Friends and Family: Twice a week    Attends Religious Services: More than 4 times per year    Active Member of [employer] or Organizations: No    Attends Banker Meetings: Never    Marital Status: Married  Catering manager Violence: Not At Risk (05/15/2024)   Humiliation, Afraid, Rape, and Kick questionnaire    Fear of Current or Ex-Partner: No    Emotionally Abused: No    Physically Abused: No    Sexually Abused: No    ROS See HPI above    Objective  BP 116/82   Pulse 90   Temp 97.7 F (36.5 C) (Oral)   Ht 6\' 2"  (1.88 m)   Wt 204 lb (92.5 kg)   SpO2 97%   BMI 26.19 kg/m   Physical Exam Vitals reviewed.  Constitutional:      General: He is not in acute distress.    Appearance: Normal appearance. He is overweight. He is not ill-appearing, toxic-appearing or diaphoretic.  HENT:     Head: Normocephalic and atraumatic.  Eyes:     General:        Right eye: No discharge.        Left eye: No discharge.     Conjunctiva/sclera: Conjunctivae normal.  Cardiovascular:     Rate and Rhythm: Normal rate and regular rhythm.     Heart sounds: Normal heart sounds. No murmur heard.    No friction rub. No gallop.  Pulmonary:     Effort: Pulmonary effort is normal. No respiratory distress.     Breath sounds: Normal breath sounds.  Musculoskeletal:        General: Normal range of motion.  Skin:    General: Skin is warm and dry.  Neurological:     General: No focal deficit present.     Mental Status: He is alert and oriented to person, place, and time. Mental status is at baseline.  Psychiatric:        Mood and Affect: Mood normal.        Behavior: Behavior normal.        Thought Content: Thought content normal.        Judgment: Judgment normal.      Assessment & Plan:  Environmental and seasonal allergies -     Ambulatory referral to Allergy  Overweight -     CBC with  Differential/Platelet -     Comprehensive metabolic panel with GFR -     Hemoglobin A1c -     Lipid panel -  TSH   1.Review health maintenance:  -Covid booster: March 2025; [facility] at [facility] [address] [facility] [address] [facility]  -Tdap vaccine: Unknown; recommend to obtain every 10 years or if he has an injury  2.Ordered labs (CBC, CMP, A1c, Lipid, and TSH) based on BMI-overweight.  3.Placed a referral to allergy for environmental and seasonal allergies.  Return in about 1 year (around 05/15/2025) for physical.   Faduma Cho, NP

## 2024-05-15 NOTE — Patient Instructions (Signed)
-  It was nice to meet you and look forward to taking care of you. -Ordered labs based on BMI-overweight. Office will call with lab results and you will see them on MyChart.  -Placed a referral to allergy for environmental and seasonal allergies.  -Recommend to obtain Tdap vaccine every 10 years or if you have an injury. -Follow up in 1 year for a physical.

## 2024-05-16 ENCOUNTER — Ambulatory Visit: Payer: Self-pay | Admitting: Family Medicine

## 2024-06-06 ENCOUNTER — Ambulatory Visit (INDEPENDENT_AMBULATORY_CARE_PROVIDER_SITE_OTHER): Admitting: Otolaryngology

## 2024-06-13 ENCOUNTER — Ambulatory Visit (HOSPITAL_COMMUNITY)
Admission: RE | Admit: 2024-06-13 | Discharge: 2024-06-13 | Disposition: A | Discharge status: Home / Self Care | Source: Ambulatory Visit | Attending: Otolaryngology | Admitting: Otolaryngology

## 2024-06-13 DIAGNOSIS — J324 Chronic pansinusitis: Secondary | ICD-10-CM | POA: Insufficient documentation

## 2024-06-19 ENCOUNTER — Ambulatory Visit (INDEPENDENT_AMBULATORY_CARE_PROVIDER_SITE_OTHER): Admitting: Otolaryngology

## 2024-06-19 ENCOUNTER — Encounter (INDEPENDENT_AMBULATORY_CARE_PROVIDER_SITE_OTHER): Payer: Self-pay | Admitting: Otolaryngology

## 2024-06-19 VITALS — BP 124/80 | HR 99 | Ht 74.0 in | Wt 203.0 lb

## 2024-06-19 DIAGNOSIS — J310 Chronic rhinitis: Secondary | ICD-10-CM

## 2024-06-19 DIAGNOSIS — R0981 Nasal congestion: Secondary | ICD-10-CM

## 2024-06-19 DIAGNOSIS — J343 Hypertrophy of nasal turbinates: Secondary | ICD-10-CM | POA: Diagnosis not present

## 2024-06-19 NOTE — Addendum Note (Signed)
 Addended by: BURNIE LAURANCE CROME on: 06/19/2024 01:50 PM   Modules accepted: Orders

## 2024-06-19 NOTE — Progress Notes (Signed)
 Patient ID: James Ortiz, male   DOB: Feb 19, 1986, 38 y.o.   MRN: [id number]  Follow-up: Recurrent sinusitis, chronic headaches  Follow-up: The patient is a 38 year old male who returns today for his follow-up evaluation.  He was last seen in May 2025.  At that time, he was complaining of chronic headaches and recurrent sinusitis.  He previously underwent endoscopic sinus surgery 2 years ago.  His symptoms initially improved.  However, over the past year, he has noted frequent headaches.  At his last visit, he was noted to have bilateral inferior turbinate hypertrophy.  However, no acute infection was noted.  He subsequently underwent a sinus CT scan.  The CT showed widely patent sinus drainage pathways.  No acute or chronic sinusitis was noted.  The patient returns today complaining of persistent/recurrent headaches and facial pain.  He has also noted occasional clear rhinorrhea.  The rhinorrhea is intermittent.  Exam: General: Communicates without difficulty, well nourished, no acute distress. Head: Normocephalic, no evidence injury, no tenderness, facial buttresses intact without stepoff. Face/sinus: No tenderness to palpation and percussion. Facial movement is normal and symmetric. Eyes: PERRL, EOMI. No scleral icterus, conjunctivae clear. Neuro: CN II exam reveals vision grossly intact.  No nystagmus at any point of gaze. Ears: Auricles well formed without lesions.  Ear canals are intact without mass or lesion.  No erythema or edema is appreciated.  The TMs are intact without fluid. Nose: External evaluation reveals normal support and skin without lesions.  Dorsum is intact.  Anterior rhinoscopy reveals congested mucosa over anterior aspect of inferior turbinates and intact septum.  No purulence noted. Oral:  Oral cavity and oropharynx are intact, symmetric, without erythema or edema.  Mucosa is moist without lesions. Neck: Full range of motion without pain.  There is no significant  lymphadenopathy.  No masses palpable.  Thyroid bed within normal limits to palpation.  Parotid glands and submandibular glands equal bilaterally without mass.  Trachea is midline. Neuro:  CN 2-12 grossly intact.   Assessment: 1.  Chronic rhinitis with nasal mucosal congestion and bilateral inferior turbinate hypertrophy. 2.  No acute or chronic sinusitis was noted on his CT scan. 3.  Intermittent clear rhinorrhea. 4.  His headaches and facial pain are likely neurogenic in etiology.  Plan: 1.  The physical exam findings and the CT images are reviewed with the patient. 2.  The patient is reassured that no acute or chronic sinusitis is noted. 3.  Continue with Flonase nasal spray and nasal saline irrigation as needed. 4.  The patient will benefit from a neurology evaluation and treatment if his headaches/facial pain persists. 5.  The patient is instructed to collect his clear rhinorrhea for beta-2 transferrin testing if necessary.

## 2024-08-22 ENCOUNTER — Telehealth: Payer: Self-pay

## 2024-08-22 ENCOUNTER — Other Ambulatory Visit (HOSPITAL_COMMUNITY): Payer: Self-pay

## 2024-08-22 ENCOUNTER — Other Ambulatory Visit

## 2024-08-22 DIAGNOSIS — K50818 Crohn's disease of both small and large intestine with other complication: Secondary | ICD-10-CM

## 2024-08-22 NOTE — Telephone Encounter (Signed)
 Fecal calprotectin was collected today, so once that results, we can resubmit to insurance (granted level returns lower than 569-last reading collected 12/08/23)

## 2024-08-22 NOTE — Telephone Encounter (Signed)
 Pharmacy Patient Advocate Encounter   Received notification from CoverMyMeds that prior authorization for Amjevita  40MG /0.4ML auto-injectors is required/requested.   Insurance verification completed.   The patient is insured through [employer] .   Prior Authorization form/request asks a question that requires your assistance. Please see the question below and advise accordingly. The PA will not be submitted until the necessary information is received.

## 2024-08-26 LAB — CALPROTECTIN, FECAL: Calprotectin, Fecal: 295 ug/g — ABNORMAL HIGH (ref 0–120)

## 2024-08-26 NOTE — Telephone Encounter (Signed)
 Pharmacy Patient Advocate Encounter   Per test claim: PA required; PA submitted to above mentioned insurance via Latent Key/confirmation #/EOC [id number] Status is pending

## 2024-08-27 ENCOUNTER — Ambulatory Visit: Payer: Self-pay | Admitting: Gastroenterology

## 2024-08-28 ENCOUNTER — Encounter: Payer: Self-pay | Admitting: Gastroenterology

## 2024-08-30 ENCOUNTER — Other Ambulatory Visit (HOSPITAL_COMMUNITY): Payer: Self-pay

## 2024-08-30 NOTE — Telephone Encounter (Signed)
 Contacted insurance company and was able to update the request to expedited to hopefully receive determination by end of day. She stated that standard requests (which is what it was submitted as normally) are allowed up to 15 days in their system for review

## 2024-08-30 NOTE — Telephone Encounter (Signed)
 Inbound call from patient requesting a call back to discuss if he can get Amjevita   approved because he needs the medication this weekend. Please advise.

## 2024-08-30 NOTE — Telephone Encounter (Signed)
 Will try and contact insurance again for updates as it still shows as pending. Tried this morning but they are not open yet.

## 2024-08-30 NOTE — Telephone Encounter (Signed)
 Pharmacy Patient Advocate Encounter  Received notification from [facility] that Prior Authorization forAmjevita 40MG /0.4ML auto-injectors has been APPROVED from 08-30-2024 to 08-30-2025   PA #/Case ID/Reference #: [id number]

## 2024-09-04 ENCOUNTER — Other Ambulatory Visit: Payer: Self-pay | Admitting: Pulmonary Disease

## 2024-09-04 MED ORDER — MONTELUKAST SODIUM 10 MG PO TABS: 10.0000 mg | ORAL_TABLET | Freq: Every day | ORAL | 3 refills | Status: AC

## 2024-09-24 ENCOUNTER — Telehealth: Payer: Self-pay

## 2024-09-24 NOTE — Telephone Encounter (Signed)
 Called patient who is on the wait list and offered him an earlier appointment on Tuesday, 10-14 at 3pm with Dr. Leigh.  He confirmed that he would like to take that appointment. Patient asked to arrive 10 mins early for registration

## 2024-10-01 ENCOUNTER — Ambulatory Visit: Admitting: Gastroenterology

## 2024-10-01 ENCOUNTER — Encounter: Payer: Self-pay | Admitting: Gastroenterology

## 2024-10-01 ENCOUNTER — Ambulatory Visit: Payer: Self-pay | Admitting: Gastroenterology

## 2024-10-01 ENCOUNTER — Other Ambulatory Visit

## 2024-10-01 VITALS — BP 112/80 | HR 80 | Ht 74.0 in | Wt 204.0 lb

## 2024-10-01 DIAGNOSIS — R195 Other fecal abnormalities: Secondary | ICD-10-CM | POA: Diagnosis not present

## 2024-10-01 DIAGNOSIS — K50818 Crohn's disease of both small and large intestine with other complication: Secondary | ICD-10-CM

## 2024-10-01 DIAGNOSIS — Z79899 Other long term (current) drug therapy: Secondary | ICD-10-CM

## 2024-10-01 DIAGNOSIS — R109 Unspecified abdominal pain: Secondary | ICD-10-CM

## 2024-10-01 DIAGNOSIS — R140 Abdominal distension (gaseous): Secondary | ICD-10-CM

## 2024-10-01 LAB — COMPREHENSIVE METABOLIC PANEL WITH GFR
ALT: 18 U/L (ref 0–53)
AST: 15 U/L (ref 0–37)
Albumin: 4.5 g/dL (ref 3.5–5.2)
Alkaline Phosphatase: 60 U/L (ref 39–117)
BUN: 17 mg/dL (ref 6–23)
CO2: 29 meq/L (ref 19–32)
Calcium: 9.1 mg/dL (ref 8.4–10.5)
Chloride: 102 meq/L (ref 96–112)
Creatinine, Ser: 0.87 mg/dL (ref 0.40–1.50)
GFR: 108.74 mL/min (ref >60.00)
Glucose, Bld: 96 mg/dL (ref 70–99)
Potassium: 3.8 meq/L (ref 3.5–5.1)
Sodium: 137 meq/L (ref 135–145)
Total Bilirubin: 0.5 mg/dL (ref 0.2–1.2)
Total Protein: 7.4 g/dL (ref 6.0–8.3)

## 2024-10-01 LAB — CBC WITH DIFFERENTIAL/PLATELET
Basophils Absolute: 0.1 K/uL (ref 0.0–0.1)
Basophils Relative: 0.7 % (ref 0.0–3.0)
Eosinophils Absolute: 0.1 K/uL (ref 0.0–0.7)
Eosinophils Relative: 1.6 % (ref 0.0–5.0)
HCT: 42.1 % (ref 39.0–52.0)
Hemoglobin: 13.8 g/dL (ref 13.0–17.0)
Lymphocytes Relative: 37.4 % (ref 12.0–46.0)
Lymphs Abs: 3.2 K/uL (ref 0.7–4.0)
MCHC: 32.7 g/dL (ref 30.0–36.0)
MCV: 79.9 fl (ref 78.0–100.0)
Monocytes Absolute: 0.6 K/uL (ref 0.1–1.0)
Monocytes Relative: 7.5 % (ref 3.0–12.0)
Neutro Abs: 4.5 K/uL (ref 1.4–7.7)
Neutrophils Relative %: 52.8 % (ref 43.0–77.0)
Platelets: 241 K/uL (ref 150.0–400.0)
RBC: 5.27 Mil/uL (ref 4.22–5.81)
RDW: 14.1 % (ref 11.5–15.5)
WBC: 8.5 K/uL (ref 4.0–10.5)

## 2024-10-01 MED ORDER — NA SULFATE-K SULFATE-MG SULF 17.5-3.13-1.6 GM/177ML PO SOLN
1.0000 | Freq: Once | ORAL | 0 refills | Status: AC
Start: 2024-10-01 — End: 2024-10-01

## 2024-10-01 NOTE — Patient Instructions (Addendum)
 You have been scheduled for a colonoscopy. Please follow written instructions given to you at your visit today.   If you use inhalers (even only as needed), please bring them with you on the day of your procedure.  DO NOT TAKE 7 DAYS PRIOR TO TEST- Trulicity (dulaglutide) Ozempic, Wegovy (semaglutide) Mounjaro (tirzepatide) Bydureon Bcise (exanatide extended release)  DO NOT TAKE 1 DAY PRIOR TO YOUR TEST Rybelsus (semaglutide) Adlyxin (lixisenatide) Victoza (liraglutide) Byetta (exanatide) ___________________________________________________________________________    Please go to the lab in the basement of our building to have lab work done as you leave today. Hit B for basement when you get on the elevator.  When the doors open the lab is on your left.  We will call you with the results. Thank you.   Thank you for entrusting me with your care and for choosing [facility], Dr. Elspeth Naval    _______________________________________________________  If your blood pressure at your visit was 140/90 or greater, please contact your primary care physician to follow up on this.  _______________________________________________________  If you are age 13 or older, your body mass index should be between 23-30. Your Body mass index is 26.19 kg/m. If this is out of the aforementioned range listed, please consider follow up with your Primary Care Provider.  If you are age 39 or younger, your body mass index should be between 19-25. Your Body mass index is 26.19 kg/m. If this is out of the aformentioned range listed, please consider follow up with your Primary Care Provider.   ________________________________________________________  The [facility] providers would like to encourage you to use MYCHART to communicate with providers for non-urgent requests or questions.  Due to long hold times on the telephone, sending your provider a message by [facility] may be a faster and more  efficient way to get a response.  Please allow 48 business hours for a response.  Please remember that this is for non-urgent requests.  _______________________________________________________  [facility] is using a team-based approach to care.  Your team is made up of your doctor and two to three APPS. Our APPS (Nurse Practitioners and Physician Assistants) work with your physician to ensure care continuity for you. They are fully qualified to address your health concerns and develop a treatment plan. They communicate directly with your gastroenterologist to care for you. Seeing the Advanced Practice Practitioners on your physician's team can help you by facilitating care more promptly, often allowing for earlier appointments, access to diagnostic testing, procedures, and other specialty referrals.

## 2024-10-01 NOTE — Progress Notes (Signed)
 HPI :  Crohn's disease history: Ileal-colonic Crohn's disease.  He was diagnosed several years ago, remotely on thiopurines for a period of time but not on therapy for several years.  Was admitted in 2020 with an obstruction at the IC valve. Follow up colonoscopy with me March 2020 showed a distorted IC valve / stenosis at the ileum which could not be traversed. He was referred to Cy Brochure at [facility] who performed ileocecectomy in April 2020. Surgery went quite well and his obstructive symptoms resolved. Path from his operative report showed chronic active inflammation but no pre-cancerous changes. Historically he has wanted to avoid biologic or medical therapy for his Crohn's disease so we monitored post operatively.  He underwent a colonoscopy with me on July 02 2020. He had active inflammation at the anastomosis and ileum, Rutgeert's score i2.  Unfortunately through his colon he also had numerous erosions concerning for mild colonic activity.  Biopsies confirmed Crohn's colitis in these areas as well.  Initially placed on Humira  + thiopurine. 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  Has been on Humira  monotherapy since then.   SINCE LAST VISIT   38 year old male here for follow-up visit for Crohn's disease.  I last saw him in December 2024.  Recall at that time he was generally feeling okay.  We performed a fecal calprotectin which was elevated into the 500s.  I checked his Humira  level which was at 11 without antibodies.  He was generally feeling well so we decided to monitor.  Over time he has not had any flares of his Crohn's however states he has had some recurrent symptoms that are bothersome at times.  He feels that he may feel well for 2 weeks and then he will have 2 weeks worth of symptoms that bother him.  He can have some urgency in his stools, he is normally going 2-3 times per day that is generally formed.  No blood in his stools.  He has had some bloating  and abdominal discomfort that can come and go.  No vomiting or obstructive symptoms.  His weight has increased a few pounds, currently 204 pounds.  No severe flares or hospitalizations etc.  Recall he did get shingles in the past and has had Shingrix vaccine.  He is up-to-date on his vaccines with exception of flu shot for which he is due this year.  Has not had lab work since earlier this year.  Due for QuantiFERON gold.  He recently had another fecal calprotectin in September which was elevated to the 200s.    His last colonoscopy was in September 2022, generally things looked okay.  He had very faint/mild inflammation in his distal rectum with some mild inflammation there.       IBD Health Care Maintenance: Annual Flu Vaccine - Date 2024 UTD, due for 2025 Pneumococcal Vaccine - PCV13 done 07/14/20, PPSSV 23 Sept 2022 Zoster vaccine  done 2022/2023 TB testing if on anti-TNF, - Date 11/2023 Vitamin D  screening - UTD Last Colonoscopy - Date 2022 COVID VACCINE x 2 + Booster   Colonoscopy 03/01/19 - The perianal and digital rectal examinations were normal. - The IC valve was deformed and scarred down from Crohn's disease. A ? fistulous tract was noted just inferior to the valve. A benign-appearing, intrinsic severe stenosis was found at the ileocecal valve. The lumen was only a few mm wide and could not be traversed. No obvious inflammatory change was noted. Biopsies were taken with a  cold forceps for histology. - Polypoid lesions were found in the cecum and at the appendiceal orifice, most consistent with benign pseudoinflammatory polyps. Biopsies were taken with a cold forceps for histology. - Scattered medium-mouthed diverticula were found in the entire colon. - Internal hemorrhoids were found during retroflexion. The hemorrhoids were small. - The exam was otherwise without abnormality. No colonic inflammation   Surgery 04/12/19 - ileocecectomy - Dr. Cy Brochure Path shows chronic active  inflammation, with polypoid lesion in the cecum removed - no evidence of atypical lymphoid proliferation   CT scan 06/26/19 - s/p ileocecectomy, multiple benign appearing lymph nodes in RLQ, benign small pulmonary nodule   Colonoscopy 07/02/20 - The perianal and digital rectal examinations were normal. - There was evidence of a prior end-to-side ileo-colonic anastomosis in the ascending colon. This was patent and was characterized by a few apthous ulcerations but was widely patent. - Patchy inflammation, graded as Rutgeerts Score i2 (more than five aphthous lesions with normal intervening mucosa or skip areas of larger lesions or lesions confined to the ileocolonic anastomosis) and characterized by erosions and aphthous ulcerations was found in the terminal ileum. - A few isolated erosions were found in the descending colon, in the transverse colon and in the ascending colon. Biopsies were taken with a cold forceps for histology. - Scattered small-mouthed diverticula were found in the transverse colon and left colon. - Internal hemorrhoids were found during retroflexion. - The exam was otherwise without abnormality.   Diagnosis Surgical [P], colon - CHRONIC ACTIVE COLITIS - NO GRANULOMATA, DYSPLASIA OR MALIGNANCY IDENTIFIED - SEE COMMENT   Colonoscopy 08/20/21: The perianal and digital rectal examinations were normal. - The terminal ileum appeared normal. - There was evidence of a prior end-to-end ileo-colonic anastomosis in the ascending colon. This was patent and was characterized by healthy appearing mucosa. The anastomosis was traversed. - Scattered medium-mouthed diverticula were found in the transverse colon and left colon. - A diminutive polyp was found in the sigmoid colon. The polyp was sessile. The polyp was removed with a cold biopsy forceps. Resection and retrieval were complete. - Internal hemorrhoids were found during retroflexion. - An area of mildly altered mucosa was  found in the distal rectum, possible very mildly active colitis. Biopsies were taken with a cold forceps for histology. - The exam was otherwise without abnormality. - Biopsies were taken with a cold forceps in the sigmoid colon, in the descending colon, in the transverse colon and in the ascending colon for histology.   1. Surgical [P], right colon bx - BENIGN COLONIC MUCOSA - NO ACTIVE INFLAMMATION OR EVIDENCE OF MICROSCOPIC COLITIS - NO HIGH GRADE DYSPLASIA OR MALIGNANCY IDENTIFIED 2. Surgical [P], transverse colon bx - BENIGN COLONIC MUCOSA - NO ACTIVE INFLAMMATION OR EVIDENCE OF MICROSCOPIC COLITIS - NO HIGH GRADE DYSPLASIA OR MALIGNANCY IDENTIFIED 3. Surgical [P], left colon bx - BENIGN COLONIC MUCOSA - NO ACTIVE INFLAMMATION OR EVIDENCE OF MICROSCOPIC COLITIS - NO HIGH GRADE DYSPLASIA OR MALIGNANCY IDENTIFIED 4. Surgical [P], colon, rectum bx - CHRONIC FOCALLY ACTIVE COLITIS - NO GRANULOMATA, DYSPLASIA OR MALIGNANCY IDENTIFIED 5. Surgical [P], colon, sigmoid polyp x1, polyp (1) - BENIGN COLONIC MUCOSA WITH UNDERLYING LYMPHOID AGGREGATE (1 OF 1 FRAGMENTS) - NO HIGH-GRADE DYSPLASIA OR MALIGNANCY IDENTIFIED - SEE COMMENT   Fecal calprotectin Jan 2024 - 105   Fecal calprotectin 12/08/23 - 520  Humira  AB 12/22/23 - level of 11, no antibodies  Fecal calprotectin 08/23/24 - 295   Past Medical History:  Diagnosis Date  Chest pain    negative stress test   Contusion of rib on left side    Corneal abrasion    non-healing, wore a permanent contact and finally removed yesterday   Crohn's disease (HCC)    ileal disease s/p ileocecectomy    RUQ pain    Sinusitis    Urticaria      Past Surgical History:  Procedure Laterality Date   COLON SURGERY     2020   COLONOSCOPY  08/20/2021   EYE SURGERY Bilateral    KNEE SURGERY     VASECTOMY     Family History  Problem Relation Age of Onset   Crohn's disease Father    Mental illness Sister    Colon cancer Neg Hx     Esophageal cancer Neg Hx    Stomach cancer Neg Hx    Social History   Tobacco Use   Smoking status: Former    Current packs/day: 1.00    Average packs/day: 1 pack/day for 12.0 years (12.0 ttl pk-yrs)    Types: Cigarettes   Smokeless tobacco: Never  Vaping Use   Vaping status: Former   Devices: trying to quit   Substance Use Topics   Alcohol use: Yes    Comment: Weekends   Drug use: No   Current Outpatient Medications  Medication Sig Dispense Refill   Adalimumab -atto (AMJEVITA ) 40 MG/0.4ML SOAJ Inject 1 pen  into the skin every 14 (fourteen) days. 0.8 mL 11   albuterol (VENTOLIN HFA) 108 (90 Base) MCG/ACT inhaler Inhale 1 puff into the lungs as needed.     carboxymethylcellulose (REFRESH PLUS) 0.5 % SOLN Place 1 drop into the left eye 3 (three) times daily as needed.     finasteride (PROPECIA) 1 MG tablet Take 1 mg by mouth daily. Hair loss     fluticasone (FLONASE) 50 MCG/ACT nasal spray Place into the nose.     minoxidil (LONITEN) 2.5 MG tablet Take 2.5 mg by mouth daily.     montelukast  (SINGULAIR ) 10 MG tablet Take 1 tablet (10 mg total) by mouth daily. 90 tablet 3   Multiple Vitamins-Minerals (MULTIVITAMIN WITH MINERALS) tablet Take 1 tablet by mouth daily.     Tadalafil 2.5 MG TABS Take 2.5 tablets by mouth daily at 6 (six) AM.     No current facility-administered medications for this visit.   Allergies  Allergen Reactions   Milk-Related Compounds    Other Other (See Comments)    Lactose intolerant but able to consume cheese. Avoids milk, ice cream, pudding. MM, RD 04/15/19 Lactose intolerant but able to consume cheese. Avoids milk, ice cream, pudding. MM, RD 04/15/19      Review of Systems: All systems reviewed and negative except where noted in HPI.   Labs reviewed in Epic  Physical Exam: BP 112/80   Pulse 80   Ht 6' 2 (1.88 m)   Wt 204 lb (92.5 kg)   BMI 26.19 kg/m  Constitutional: Pleasant,well-developed, male in no acute distress. Neurological: Alert  and oriented to person place and time. Psychiatric: Normal mood and affect. Behavior is normal.   ASSESSMENT: 38 y.o. male here for assessment of the following  1. Crohn's disease of both small and large intestine with other complication (HCC)   2. High risk medication use   3. Elevated fecal calprotectin    Longstanding Crohn's disease status post ileocecectomy on baseline Humira  (had hepatotoxicity from thiopurine's).  In general has been doing okay over the past few years however he  has had elevated fecal calprotectin on the last 2 times checked and has had some intermittent symptoms since I have seen him.  Certainly no severe flare of his Crohn's disease leading to ED or hospitalization, however clinically it sounds like he is not in remission and his fecal calprotectin's would support that.  When last checking his Humira  levels it appeared therapeutic without antibodies.  We discussed options moving forward.  I think it would be good to restage his disease with colonoscopy.  Discussed risk benefits of colonoscopy and he wishes to proceed.  If he has active inflammation on this exam, which I suspect may be likely, we will discuss options to transition from Humira  to another therapy.  We briefly discussed options today, namely Entyvio, Rinvoq, IL inhibitors, reviewed each of these with safety profile, risks etc.  I encouraged him to look at the [facility] website to learn more about these.  Over time he has had relatively mild Crohn's disease, Entyvio could work for him and that offers the best safety profile.  Alternatively if he wanted an oral regimen then Rinvoq would be a great option with excellent efficacy although discussed risks of that.  Will first await his colonoscopy and then make decisions about his treatment pending that result.  In interim he is due for some basic labs today.  Will also rescreen him for tuberculosis and viral hepatitis in case we transition to therapy.  Flu shot is  recommended this season and he should pursue that, we did not have any available in the office today.    PLAN: - lab today - CBC, CMET, quantiferon gold, hepatitis B surface antigen, hepatitis B core antibody - continue Humira  biosimilar for now - schedule colonoscopy at the [facility] - I suspect we may change therapy based on colonoscopy results - discussed options, he will think about it and look at [facility] - either entyvio, Rinvoq, or Tremfya / Skyrizi more than likely - flu shot recommended  Marcey Naval, MD [facility]

## 2024-10-02 LAB — HEPATITIS B CORE ANTIBODY, TOTAL: Hep B Core Total Ab: NONREACTIVE

## 2024-10-02 LAB — HEPATITIS B SURFACE ANTIGEN: Hepatitis B Surface Ag: NONREACTIVE

## 2024-10-03 LAB — QUANTIFERON-TB GOLD PLUS
Mitogen-NIL: 6.99 [IU]/mL
NIL: 0.01 [IU]/mL
QuantiFERON-TB Gold Plus: NEGATIVE
TB1-NIL: 0 [IU]/mL
TB2-NIL: 0 [IU]/mL

## 2024-10-17 ENCOUNTER — Encounter: Admitting: Gastroenterology

## 2024-10-24 ENCOUNTER — Ambulatory Visit: Admitting: Gastroenterology

## 2024-10-24 ENCOUNTER — Encounter: Payer: Self-pay | Admitting: Gastroenterology

## 2024-10-25 ENCOUNTER — Encounter: Payer: Self-pay | Admitting: Gastroenterology

## 2024-10-25 ENCOUNTER — Ambulatory Visit: Admitting: Gastroenterology

## 2024-10-25 VITALS — BP 98/66 | HR 83 | Temp 97.3°F | Resp 10 | Ht 74.0 in | Wt 204.0 lb

## 2024-10-25 DIAGNOSIS — K648 Other hemorrhoids: Secondary | ICD-10-CM | POA: Diagnosis not present

## 2024-10-25 DIAGNOSIS — R195 Other fecal abnormalities: Secondary | ICD-10-CM

## 2024-10-25 DIAGNOSIS — K50818 Crohn's disease of both small and large intestine with other complication: Secondary | ICD-10-CM | POA: Diagnosis present

## 2024-10-25 DIAGNOSIS — K5730 Diverticulosis of large intestine without perforation or abscess without bleeding: Secondary | ICD-10-CM

## 2024-10-25 MED ORDER — SODIUM CHLORIDE 0.9 % IV SOLN: 500.0000 mL | Freq: Once | INTRAVENOUS | Status: DC

## 2024-10-25 NOTE — Progress Notes (Signed)
 To pacu, VSS. Report to Rn.tb

## 2024-10-25 NOTE — Op Note (Signed)
 [facility] Patient Name: James Ortiz Procedure Date: 10/25/2024 1:50 PM MRN: [id number] Endoscopist: Elspeth P. Leigh , MD, [id number] Age: 38 Referring MD:  Date of Birth: 1986-01-07 Gender: Male Account #: [id number] Procedure:                Colonoscopy Indications:              Disease activity assessment of Crohn's disease of                            the small bowel and colon - on Humira  with                            therapeutic levels (earlier this year), fecal                            calprotectin x 2 elevated (200s to 500s),                            clinically feeling well Medicines:                Monitored Anesthesia Care Procedure:                Pre-Anesthesia Assessment:                           - Prior to the procedure, a History and Physical                            was performed, and patient medications and                            allergies were reviewed. The patient's tolerance of                            previous anesthesia was also reviewed. The risks                            and benefits of the procedure and the sedation                            options and risks were discussed with the patient.                            All questions were answered, and informed consent                            was obtained. Prior Anticoagulants: The patient has                            taken no anticoagulant or antiplatelet agents. ASA                            Grade Assessment: II - A patient with mild systemic  disease. After reviewing the risks and benefits,                            the patient was deemed in satisfactory condition to                            undergo the procedure.                           After obtaining informed consent, the colonoscope                            was passed under direct vision. Throughout the                            procedure, the patient's blood pressure, pulse, and                             oxygen saturations were monitored continuously. The                            Olympus Scope SN: [id number] was introduced through                            the anus and advanced to the the terminal ileum.                            The colonoscopy was performed without difficulty.                            The patient tolerated the procedure well. The                            quality of the bowel preparation was good. The                            surgical anastomosis, terminal ileum and the rectum                            were photographed. Scope In: 2:10:15 PM Scope Out: 2:22:48 PM Scope Withdrawal Time: 0 hours 9 minutes 38 seconds  Total Procedure Duration: 0 hours 12 minutes 33 seconds  Findings:                 The perianal and digital rectal examinations were                            normal.                           There was evidence of a prior end-to-end                            ileo-colonic anastomosis in the ascending colon.  This was patent and was characterized by healthy                            appearing mucosa. No inflammation.                           The terminal ileum appeared normal. No inflammation.                           Scattered diverticula were found in the entire                            colon.                           Patchy suspected very mild inflammation                            characterized by mild erosions was found in the                            rectum, in the recto-sigmoid colon and in the                            sigmoid colon. Biopsies were taken with a cold                            forceps for histology.                           Internal hemorrhoids were found during retroflexion.                           The exam was otherwise without abnormality. Complications:            No immediate complications. Estimated blood loss:                             Minimal. Estimated Blood Loss:     Estimated blood loss was minimal. Impression:               - Patent end-to-end ileo-colonic anastomosis,                            characterized by healthy appearing mucosa.                           - The examined portion of the ileum was normal.                           - Diverticulosis in the entire examined colon.                           - Patchy very mild / subtle inflammation was found  in the rectum, in the recto-sigmoid colon and in                            the sigmoid colon. Biopsied.                           - Internal hemorrhoids.                           - The examination was otherwise normal.                           Generally no significant inflammation or concerning                            findings. Surgical anastomosis looks excellent, no                            inflammation. Potentially some very mild active                            inflammation in the left colon. Recommendation:           - Patient has a contact number available for                            emergencies. The signs and symptoms of potential                            delayed complications were discussed with the                            patient. Return to normal activities tomorrow.                            Written discharge instructions were provided to the                            patient.                           - Resume previous diet.                           - Continue present medications.                           - Await pathology results. Will await biopsy                            results. If no significant inflammation or symptoms                            can continue Humira , will discuss with the patient Elspeth SQUIBB. Darbi Chandran, MD 10/25/2024 2:30:55 PM This report has been signed electronically.

## 2024-10-25 NOTE — Patient Instructions (Signed)
 Handouts provided about hemorrhoids and diverticulosis.  Resume previous diet.  Continue present medications.  Await pathology results.  Will await biopsy results.  If no significant inflammation or symptoms can continue Humira , will discuss with patient.   YOU HAD AN ENDOSCOPIC PROCEDURE TODAY AT THE [facility]:   Refer to the procedure report that was given to you for any specific questions about what was found during the examination.  If the procedure report does not answer your questions, please call your gastroenterologist to clarify.  If you requested that your care partner not be given the details of your procedure findings, then the procedure report has been included in a sealed envelope for you to review at your convenience later.  YOU SHOULD EXPECT: Some feelings of bloating in the abdomen. Passage of more gas than usual.  Walking can help get rid of the air that was put into your GI tract during the procedure and reduce the bloating. If you had a lower endoscopy (such as a colonoscopy or flexible sigmoidoscopy) you may notice spotting of blood in your stool or on the toilet paper. If you underwent a bowel prep for your procedure, you may not have a normal bowel movement for a few days.  Please Note:  You might notice some irritation and congestion in your nose or some drainage.  This is from the oxygen used during your procedure.  There is no need for concern and it should clear up in a day or so.  SYMPTOMS TO REPORT IMMEDIATELY:  Following lower endoscopy (colonoscopy or flexible sigmoidoscopy):  Excessive amounts of blood in the stool  Significant tenderness or worsening of abdominal pains  Swelling of the abdomen that is new, acute  Fever of 100F or higher   For urgent or emergent issues, a gastroenterologist can be reached at any hour by calling [phone]. Do not use MyChart messaging for urgent concerns.    DIET:  We do recommend a small meal at first,  but then you may proceed to your regular diet.  Drink plenty of fluids but you should avoid alcoholic beverages for 24 hours.  ACTIVITY:  You should plan to take it easy for the rest of today and you should NOT DRIVE or use heavy machinery until tomorrow (because of the sedation medicines used during the test).    FOLLOW UP: Our staff will call the number listed on your records the next business day following your procedure.  We will call around 7:15- 8:00 am to check on you and address any questions or concerns that you may have regarding the information given to you following your procedure. If we do not reach you, we will leave a message.     If any biopsies were taken you will be contacted by phone or by letter within the next 1-3 weeks.  Please call us  at [phone] if you have not heard about the biopsies in 3 weeks.    SIGNATURES/CONFIDENTIALITY: You and/or your care partner have signed paperwork which will be entered into your electronic medical record.  These signatures attest to the fact that that the information above on your After Visit Summary has been reviewed and is understood.  Full responsibility of the confidentiality of this discharge information lies with you and/or your care-partner.

## 2024-10-25 NOTE — Progress Notes (Signed)
 [facility] History and Physical   Primary Care Physician:  Billy Philippe SAUNDERS, NP   Reason for Procedure:   Crohn's disease  Plan:    colonoscopy     HPI: James Ortiz is a 38 y.o. male  here for colonoscopy  - history of Crohns on Humira , has had some elevated fecal calprotectin. Colonoscopy to reassess disease activity. Patient denies any bowel symptoms at this time. Otherwise feels well without any cardiopulmonary symptoms.   I have discussed risks / benefits of anesthesia and endoscopic procedure with Morton Zachary Lessen and they wish to proceed with the exams as outlined today.   The patient was provided an opportunity to ask questions and all were answered. The patient agreed with the plan.    Past Medical History:  Diagnosis Date   Chest pain    negative stress test   Contusion of rib on left side    Corneal abrasion    non-healing, wore a permanent contact and finally removed yesterday   Crohn's disease (HCC)    ileal disease s/p ileocecectomy    RUQ pain    Sinusitis    Urticaria     Past Surgical History:  Procedure Laterality Date   COLON SURGERY     2020   COLONOSCOPY  08/20/2021   EYE SURGERY Bilateral    KNEE SURGERY     NASAL SEPTUM SURGERY  2024   VASECTOMY      Prior to Admission medications   Medication Sig Start Date End Date Taking? Authorizing Provider  Adalimumab -atto (AMJEVITA ) 40 MG/0.4ML SOAJ Inject 1 pen  into the skin every 14 (fourteen) days. 02/05/24  Yes Devan Babino, Elspeth SQUIBB, MD  albuterol (VENTOLIN HFA) 108 (90 Base) MCG/ACT inhaler Inhale 1 puff into the lungs as needed. 06/27/22  Yes [provider]  carboxymethylcellulose (REFRESH PLUS) 0.5 % SOLN Place 1 drop into the left eye 3 (three) times daily as needed.   Yes [provider]  finasteride (PROPECIA) 1 MG tablet Take 1 mg by mouth daily. Hair loss   Yes [provider]  minoxidil (LONITEN) 2.5 MG tablet Take 2.5 mg by mouth  daily.   Yes [provider]  montelukast  (SINGULAIR ) 10 MG tablet Take 1 tablet (10 mg total) by mouth daily. 09/04/24  Yes Olalere, Adewale A, MD  Multiple Vitamins-Minerals (MULTIVITAMIN WITH MINERALS) tablet Take 1 tablet by mouth daily.   Yes [provider]  Tadalafil 2.5 MG TABS Take 2.5 tablets by mouth daily at 6 (six) AM.   Yes [provider]  fluticasone (FLONASE) 50 MCG/ACT nasal spray Place into the nose. 06/17/22 10/01/24  [provider]    Current Outpatient Medications  Medication Sig Dispense Refill   Adalimumab -atto (AMJEVITA ) 40 MG/0.4ML SOAJ Inject 1 pen  into the skin every 14 (fourteen) days. 0.8 mL 11   albuterol (VENTOLIN HFA) 108 (90 Base) MCG/ACT inhaler Inhale 1 puff into the lungs as needed.     carboxymethylcellulose (REFRESH PLUS) 0.5 % SOLN Place 1 drop into the left eye 3 (three) times daily as needed.     finasteride (PROPECIA) 1 MG tablet Take 1 mg by mouth daily. Hair loss     minoxidil (LONITEN) 2.5 MG tablet Take 2.5 mg by mouth daily.     montelukast  (SINGULAIR ) 10 MG tablet Take 1 tablet (10 mg total) by mouth daily. 90 tablet 3   Multiple Vitamins-Minerals (MULTIVITAMIN WITH MINERALS) tablet Take 1 tablet by mouth daily.  Tadalafil 2.5 MG TABS Take 2.5 tablets by mouth daily at 6 (six) AM.     fluticasone (FLONASE) 50 MCG/ACT nasal spray Place into the nose.     Current Facility-Administered Medications  Medication Dose Route Frequency Provider Last Rate Last Admin   0.9 %  sodium chloride  infusion  500 mL Intravenous Once Makaria Poarch, Elspeth SQUIBB, MD        Allergies as of 10/25/2024 - Review Complete 10/25/2024  Allergen Reaction Noted   Milk-related compounds Diarrhea    Other Other (See Comments) 04/15/2019    Family History  Problem Relation Age of Onset   Crohn's disease Father    Mental illness Sister    Colon cancer Neg Hx    Esophageal cancer Neg Hx    Stomach cancer Neg Hx     Social History    Socioeconomic History   Marital status: Married    Spouse name: Not on file   Number of children: 2   Years of education: Not on file   Highest education level: Bachelor's degree (e.g., BA, AB, BS)  Occupational History   Occupation: research scientist (medical)  Tobacco Use   Smoking status: Former    Current packs/day: 1.00    Average packs/day: 1 pack/day for 12.0 years (12.0 ttl pk-yrs)    Types: Cigarettes   Smokeless tobacco: Never  Vaping Use   Vaping status: Former   Devices: trying to quit   Substance and Sexual Activity   Alcohol use: Yes    Comment: Weekends   Drug use: No   Sexual activity: Yes  Other Topics Concern   Not on file  Social History Narrative   Not on file   Social Drivers of Health   Financial Resource Strain: Low Risk  (05/15/2024)   Overall Financial Resource Strain (CARDIA)    Difficulty of Paying Living Expenses: Not hard at all  Food Insecurity: No Food Insecurity (05/15/2024)   Hunger Vital Sign    Worried About Running Out of Food in the Last Year: Never true    Ran Out of Food in the Last Year: Never true  Transportation Needs: No Transportation Needs (05/15/2024)   PRAPARE - Administrator, Civil Service (Medical): No    Lack of Transportation (Non-Medical): No  Physical Activity: Insufficiently Active (05/15/2024)   Exercise Vital Sign    Days of Exercise per Week: 3 days    Minutes of Exercise per Session: 20 min  Stress: No Stress Concern Present (05/15/2024)   [employer] of Occupational Health - Occupational Stress Questionnaire    Feeling of Stress : Not at all  Social Connections: Moderately Integrated (05/15/2024)   Social Connection and Isolation Panel    Frequency of Communication with Friends and Family: More than three times a week    Frequency of Social Gatherings with Friends and Family: Twice a week    Attends Religious Services: More than 4 times per year    Active Member of [employer] or Organizations: No    Attends Occupational Hygienist Meetings: Never    Marital Status: Married  Catering Manager Violence: Not At Risk (05/15/2024)   Humiliation, Afraid, Rape, and Kick questionnaire    Fear of Current or Ex-Partner: No    Emotionally Abused: No    Physically Abused: No    Sexually Abused: No    Review of Systems: All other review of systems negative except as mentioned in the HPI.  Physical Exam: Vital signs BP 128/86  Pulse (!) 107   Temp (!) 97.3 F (36.3 C) (Temporal)   Ht 6' 2 (1.88 m)   Wt 204 lb (92.5 kg)   SpO2 97%   BMI 26.19 kg/m   General:   Alert,  Well-developed, pleasant and cooperative in NAD Lungs:  Clear throughout to auscultation.   Heart:  Regular rate and rhythm Abdomen:  Soft, nontender and nondistended.   Neuro/Psych:  Alert and cooperative. Normal mood and affect. A and O x 3  Marcey Naval, MD [facility]

## 2024-10-28 ENCOUNTER — Telehealth: Payer: Self-pay

## 2024-10-28 NOTE — Telephone Encounter (Signed)
No answer on follow up call. 

## 2024-10-30 ENCOUNTER — Ambulatory Visit: Payer: Self-pay | Admitting: Gastroenterology

## 2024-10-30 LAB — SURGICAL PATHOLOGY

## 2024-12-23 ENCOUNTER — Other Ambulatory Visit: Payer: Self-pay | Admitting: Gastroenterology

## 2025-01-15 ENCOUNTER — Ambulatory Visit: Admitting: Adult Health

## 2025-01-15 ENCOUNTER — Encounter: Payer: Self-pay | Admitting: Adult Health

## 2025-01-15 ENCOUNTER — Ambulatory Visit (INDEPENDENT_AMBULATORY_CARE_PROVIDER_SITE_OTHER)

## 2025-01-15 ENCOUNTER — Ambulatory Visit: Payer: Self-pay | Admitting: Adult Health

## 2025-01-15 VITALS — BP 115/80 | HR 73 | Temp 97.9°F | Ht 74.0 in | Wt 204.0 lb

## 2025-01-15 DIAGNOSIS — J310 Chronic rhinitis: Secondary | ICD-10-CM

## 2025-01-15 DIAGNOSIS — J209 Acute bronchitis, unspecified: Secondary | ICD-10-CM

## 2025-01-15 DIAGNOSIS — G4733 Obstructive sleep apnea (adult) (pediatric): Secondary | ICD-10-CM

## 2025-01-15 DIAGNOSIS — Z87891 Personal history of nicotine dependence: Secondary | ICD-10-CM | POA: Diagnosis not present

## 2025-01-15 MED ORDER — ALBUTEROL SULFATE HFA 108 (90 BASE) MCG/ACT IN AERS: 1.0000 | INHALATION_SPRAY | Freq: Four times a day (QID) | RESPIRATORY_TRACT | 1 refills | Status: AC | PRN

## 2025-01-15 MED ORDER — ALBUTEROL SULFATE (2.5 MG/3ML) 0.083% IN NEBU
2.5000 mg | INHALATION_SOLUTION | Freq: Once | RESPIRATORY_TRACT | Status: AC
  Administered 2025-01-15: 2.5 mg via RESPIRATORY_TRACT

## 2025-01-15 MED ORDER — BENZONATATE 200 MG PO CAPS: 200.0000 mg | ORAL_CAPSULE | Freq: Three times a day (TID) | ORAL | 1 refills | Status: AC | PRN

## 2025-01-15 MED ORDER — PREDNISONE 10 MG PO TABS: ORAL_TABLET | ORAL | 0 refills | Status: AC

## 2025-01-15 NOTE — Patient Instructions (Signed)
 Sleep with Head of bed elevated at 30 incline.  Avoid sleeping on your back  Work on healthy weight .   Prednisone  taper over next week Albuterol  inhaler As needed   Liquid Mucinex DM 2 tsp Twice daily  As needed  cough  Tessalon  Three times a day  As needed  cough  Allegra 180mg  daily for 2 weeks and then As needed   Flonase nasal daily  Saline nasal spray Twice daily   Saline nasal gel At bedtime   Continue on Singulair  10mg  At bedtime   Follow up with Dr. Neda or Hieu Herms NP  in 3-4 months and As needed   Please contact office for sooner follow up if symptoms do not improve or worsen or seek emergency care

## 2025-01-15 NOTE — Progress Notes (Signed)
 "  @Patient  ID: James Ortiz, male    DOB: 04-21-1986, 39 y.o.   MRN: [id number]  Chief Complaint  Patient presents with   Cough    Referring provider: Billy Philippe SAUNDERS, NP  HPI: 39 year old male former smoker seen for consult October 2023 for chronic cough felt secondary to chronic rhinitis  Seen for sleep consult July 2024 found to have mild obstructive sleep apnea Medical history significant for Crohn;s Disease     TEST/EVENTS : Reviewed 01/15/2025  Home sleep study that was done on July 25, 2023 that showed mild sleep apnea with a AHI at 11.1/hour and SpO2 low at 85%   Chest xray 06/2022 Clear    Discussed the use of AI scribe software for clinical note transcription with the patient, who gave verbal consent to proceed.  History of Present Illness James Ortiz is a 39 year old male with mild sleep apnea and chronic allergic rhinitis who presents with a persistent cough for last 4 weeks.   He has been experiencing a persistent cough for about a month. The cough is non-painful, productive of cloudy to occasionally yellow sputum, and occurs in episodes every three days where he coughs until he clears something. The frequency of the cough has decreased from every hour to a few times a day, but it still lingers.  He has been treated for Bronchitis  with two courses of antibiotics, the most recent being a Z-Pak completed two days ago, and received a steroid injection which provided significant relief. Despite these treatments, the cough persists. He is not currently using any specific medication for the cough.  No fever is present, but he mentions slight body aches at the onset of symptoms, initially thought to be a cold. No significant exposure to sick contacts, although his children, aged four and six, have been ill intermittently. Occasional wheezing and drainage in the throat are noted, but no sore throat.  His past medical history includes mild sleep  apnea which has been improved with less symptom burden. Had prior surgery to correct a deviated septum. Using nasal spray. .     Allergies[1]  Immunization History  Administered Date(s) Administered   Influenza, Seasonal, Injecte, Preservative Fre 11/29/2023   Influenza,inj,Quad PF,6+ Mos 10/11/2018   Influenza-Unspecified 09/22/2018   Pneumococcal Conjugate-13 07/14/2020   Pneumococcal Polysaccharide-23 05/13/2021    Past Medical History:  Diagnosis Date   Chest pain    negative stress test   Contusion of rib on left side    Corneal abrasion    non-healing, wore a permanent contact and finally removed yesterday   Crohn's disease (HCC)    ileal disease s/p ileocecectomy    RUQ pain    Sinusitis    Urticaria     Tobacco History: Tobacco Use History[2] Counseling given: Not Answered Tobacco comments: Quit smoking around 2017. Smoked for 10 years, abotu 1/2ppd   Outpatient Medications Prior to Visit  Medication Sig Dispense Refill   albuterol  (VENTOLIN  HFA) 108 (90 Base) MCG/ACT inhaler Inhale 1 puff into the lungs as needed.     AMJEVITA  40 MG/0.4ML SOAJ INJECT 1 PEN SUBCUTANEOUSLY  EVERY OTHER WEEK 0.8 mL 11   carboxymethylcellulose (REFRESH PLUS) 0.5 % SOLN Place 1 drop into the left eye 3 (three) times daily as needed.     finasteride (PROPECIA) 1 MG tablet Take 1 mg by mouth daily. Hair loss     fluticasone (FLONASE) 50 MCG/ACT nasal spray Place into the nose.  minoxidil (LONITEN) 2.5 MG tablet Take 2.5 mg by mouth daily.     montelukast  (SINGULAIR ) 10 MG tablet Take 1 tablet (10 mg total) by mouth daily. 90 tablet 3   Multiple Vitamins-Minerals (MULTIVITAMIN WITH MINERALS) tablet Take 1 tablet by mouth daily.     Tadalafil 2.5 MG TABS Take 2.5 tablets by mouth daily at 6 (six) AM. (Patient taking differently: Take 2.5 mg by mouth daily at 6 (six) AM.)     No facility-administered medications prior to visit.     Review of Systems:   Constitutional:   No   weight loss, night sweats,  Fevers, chills, fatigue, or  lassitude.  HEENT:   No headaches,  Difficulty swallowing,  Tooth/dental problems, or  Sore throat,                No sneezing, itching, ear ache, +nasal congestion, post nasal drip,   CV:  No chest pain,  Orthopnea, PND, swelling in lower extremities, anasarca, dizziness, palpitations, syncope.   GI  No heartburn, indigestion, abdominal pain, nausea, vomiting, diarrhea, change in bowel habits, loss of appetite, bloody stools.   Resp:   No chest wall deformity  Skin: no rash or lesions.  GU: no dysuria, change in color of urine, no urgency or frequency.  No flank pain, no hematuria   MS:  No joint pain or swelling.  No decreased range of motion.  No back pain.    Physical Exam  BP 115/80   Pulse 73   Temp 97.9 F (36.6 C)   Ht 6' 2 (1.88 m) Comment: Per pt  Wt 204 lb (92.5 kg)   SpO2 95% Comment: RA  BMI 26.19 kg/m   GEN: A/Ox3; pleasant , NAD, well nourished    HEENT:  [place]/AT,  EACs-clear, TMs-wnl, NOSE-clear drainage , THROAT-clear, no lesions, no postnasal drip or exudate noted.   NECK:  Supple w/ fair ROM; no JVD; normal carotid impulses w/o bruits; no thyromegaly or nodules palpated; no lymphadenopathy.    RESP  few trace rhonchi . no accessory muscle use, no dullness to percussion  CARD:  RRR, no m/r/g, no peripheral edema, pulses intact, no cyanosis or clubbing.  GI:   Soft & nt; nml bowel sounds; no organomegaly or masses detected.   Musco: Warm bil, no deformities or joint swelling noted.   Neuro: alert, no focal deficits noted.    Skin: Warm, no lesions or rashes    Lab Results:Reviewed 01/15/2025   CBC    Component Value Date/Time   WBC 8.5 10/01/2024 1556   RBC 5.27 10/01/2024 1556   HGB 13.8 10/01/2024 1556   HCT 42.1 10/01/2024 1556   PLT 241.0 10/01/2024 1556   MCV 79.9 10/01/2024 1556   MCH 25.3 (L) 02/01/2019 0452   MCHC 32.7 10/01/2024 1556   RDW 14.1 10/01/2024 1556   LYMPHSABS  3.2 10/01/2024 1556   MONOABS 0.6 10/01/2024 1556   EOSABS 0.1 10/01/2024 1556   BASOSABS 0.1 10/01/2024 1556    BMET    Component Value Date/Time   NA 137 10/01/2024 1556   K 3.8 10/01/2024 1556   CL 102 10/01/2024 1556   CO2 29 10/01/2024 1556   GLUCOSE 96 10/01/2024 1556   BUN 17 10/01/2024 1556   CREATININE 0.87 10/01/2024 1556   CALCIUM 9.1 10/01/2024 1556   GFRNONAA >60 02/01/2019 0452   GFRAA >60 02/01/2019 0452    BNP No results found for: BNP  ProBNP No results found for: PROBNP  Imaging: No results found.  Administration History     None           No data to display          No results found for: NITRICOXIDE      No data to display              Assessment & Plan:   Assessment and Plan Assessment & Plan Acute bronchitis  -slow to resolve with upper airway cough syndrome.  He has a persistent cough for over a month, with episodes of productive cough and occasional yellow sputum. Recent treatment with azithromycin and Omnicef and a steroid injection provided significant relief, but symptoms persist. There is no fever or sinus tenderness. Suspect postnasal drip is ongoing trigger and component of reactive airways Hold on further antibiotics for now. Chest xray shows bronchitic changes with out pneumonia. Will treat with brief prednisone  taper. Albuterol  neb given in the office .  Add Allegra daily for 2 weeks and As needed  . Cough control with Mucinex DM and Tessalon  .   Chronic rhinitis  -ongoing post nasal drip.  Current symptoms include nasal drainage and post nasal drip,   Add Allegra daily for 2 weeks and As needed    Mild OSA -Minimal symptoms  Sleep with HOB elevated at 30 degree incline.   Plan  Patient Instructions  Sleep with Head of bed elevated at 30 incline.  Avoid sleeping on your back  Work on healthy weight .   Prednisone  taper over next week Albuterol  inhaler As needed   Liquid Mucinex DM 2 tsp Twice daily   As needed  cough  Tessalon  Three times a day  As needed  cough  Allegra 180mg  daily for 2 weeks and then As needed   Flonase nasal daily  Saline nasal spray Twice daily   Saline nasal gel At bedtime   Continue on Singulair  10mg  At bedtime   Follow up with Dr. Neda or Altair Appenzeller NP  in 3-4 months and As needed   Please contact office for sooner follow up if symptoms do not improve or worsen or seek emergency care            Teara Duerksen, NP 01/15/2025     [1]  Allergies Allergen Reactions   Milk-Related Compounds Diarrhea   Other Other (See Comments)    Lactose intolerant but able to consume cheese. Avoids milk, ice cream, pudding. MM, RD 04/15/19 Lactose intolerant but able to consume cheese. Avoids milk, ice cream, pudding. MM, RD 04/15/19   [2]  Social History Tobacco Use  Smoking Status Former   Current packs/day: 1.00   Average packs/day: 1 pack/day for 12.0 years (12.0 ttl pk-yrs)   Types: Cigarettes  Smokeless Tobacco Never  Tobacco Comments   Quit smoking around 2017. Smoked for 10 years, abotu 1/2ppd   "
